# Patient Record
Sex: Female | Born: 1965
Health system: Southern US, Community
[De-identification: ages and names within clinical notes are randomized; demographics above are authoritative.]

## PROBLEM LIST (undated history)

## (undated) ENCOUNTER — Emergency Department (HOSPITAL_BASED_OUTPATIENT_CLINIC_OR_DEPARTMENT_OTHER): Admission: EM | Payer: 59 | Source: Home / Self Care

## (undated) DIAGNOSIS — E079 Disorder of thyroid, unspecified: Secondary | ICD-10-CM

## (undated) DIAGNOSIS — F329 Major depressive disorder, single episode, unspecified: Secondary | ICD-10-CM

## (undated) DIAGNOSIS — R519 Headache, unspecified: Secondary | ICD-10-CM

## (undated) DIAGNOSIS — F32A Depression, unspecified: Secondary | ICD-10-CM

## (undated) DIAGNOSIS — E039 Hypothyroidism, unspecified: Secondary | ICD-10-CM

## (undated) DIAGNOSIS — F419 Anxiety disorder, unspecified: Secondary | ICD-10-CM

## (undated) DIAGNOSIS — R51 Headache: Secondary | ICD-10-CM

## (undated) DIAGNOSIS — Z87442 Personal history of urinary calculi: Secondary | ICD-10-CM

## (undated) HISTORY — PX: OTHER SURGICAL HISTORY: SHX169

## (undated) HISTORY — DX: Disorder of thyroid, unspecified: E07.9

---

## 2002-01-28 ENCOUNTER — Other Ambulatory Visit: Admission: RE | Admit: 2002-01-28 | Discharge: 2002-01-28 | Payer: Self-pay | Admitting: Obstetrics & Gynecology

## 2004-09-29 ENCOUNTER — Emergency Department (HOSPITAL_COMMUNITY): Admission: EM | Admit: 2004-09-29 | Discharge: 2004-09-29 | Payer: Self-pay | Admitting: Emergency Medicine

## 2005-01-04 ENCOUNTER — Ambulatory Visit (HOSPITAL_COMMUNITY): Admission: RE | Admit: 2005-01-04 | Discharge: 2005-01-04 | Payer: Self-pay | Admitting: Family Medicine

## 2005-11-22 ENCOUNTER — Ambulatory Visit (HOSPITAL_COMMUNITY): Admission: RE | Admit: 2005-11-22 | Discharge: 2005-11-22 | Payer: Self-pay | Admitting: Family Medicine

## 2005-12-12 ENCOUNTER — Ambulatory Visit (HOSPITAL_COMMUNITY): Admission: RE | Admit: 2005-12-12 | Discharge: 2005-12-12 | Payer: Self-pay | Admitting: Family Medicine

## 2006-07-24 ENCOUNTER — Ambulatory Visit: Payer: Self-pay | Admitting: Orthopedic Surgery

## 2007-01-17 ENCOUNTER — Ambulatory Visit (HOSPITAL_COMMUNITY): Admission: RE | Admit: 2007-01-17 | Discharge: 2007-01-17 | Payer: Self-pay | Admitting: Family Medicine

## 2008-01-05 ENCOUNTER — Ambulatory Visit (HOSPITAL_COMMUNITY): Admission: RE | Admit: 2008-01-05 | Discharge: 2008-01-05 | Payer: Self-pay | Admitting: Family Medicine

## 2008-12-23 ENCOUNTER — Ambulatory Visit (HOSPITAL_COMMUNITY): Admission: RE | Admit: 2008-12-23 | Discharge: 2008-12-23 | Payer: Self-pay | Admitting: Family Medicine

## 2009-12-13 ENCOUNTER — Ambulatory Visit (HOSPITAL_COMMUNITY): Admission: RE | Admit: 2009-12-13 | Discharge: 2009-12-13 | Payer: Self-pay | Admitting: Family Medicine

## 2009-12-21 ENCOUNTER — Ambulatory Visit (HOSPITAL_COMMUNITY): Admission: RE | Admit: 2009-12-21 | Discharge: 2009-12-21 | Payer: Self-pay | Admitting: Family Medicine

## 2010-11-08 IMAGING — US US BREAST*R*
1 series · 10 of 10 positions shown · non-contrast
Comparison: 12/13/2009 and 01/05/2008

CLINICAL DATA: Possible mass right breast identified on recent
screening mammogram dated 12/13/2009.

DIGITAL DIAGNOSTIC RIGHT MAMMOGRAM WITHOUT CAD AND RIGHT BREAST
ULTRASOUND:

[Series 1: us breast*right* · 0.08mm/px · 10 of 10 slices shown]
[im 1/10]
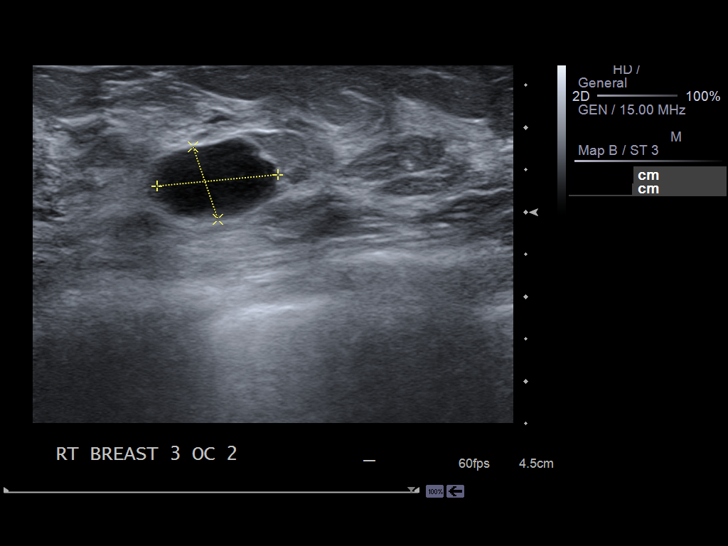
[im 2/10]
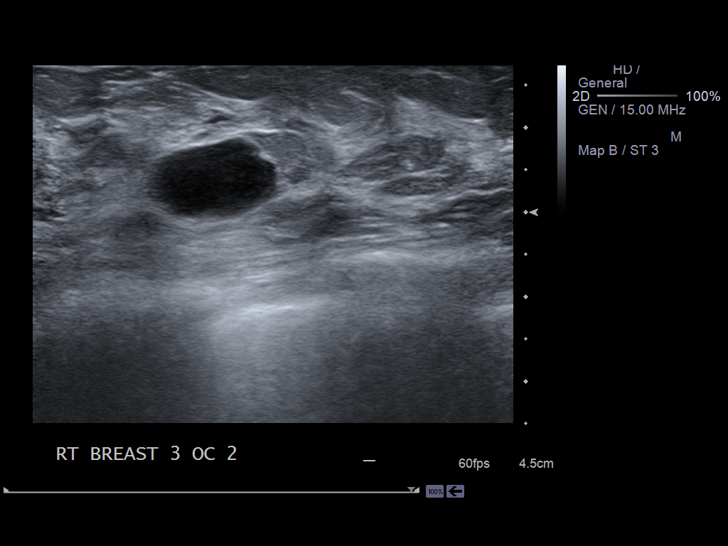
[im 3/10]
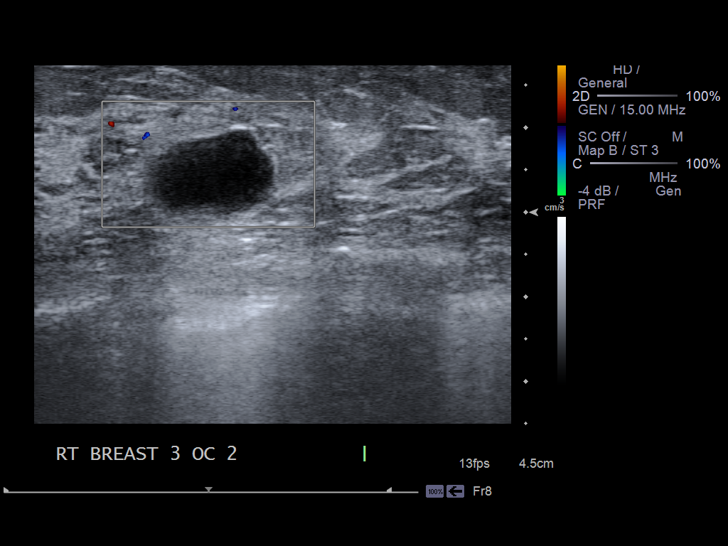
[im 4/10]
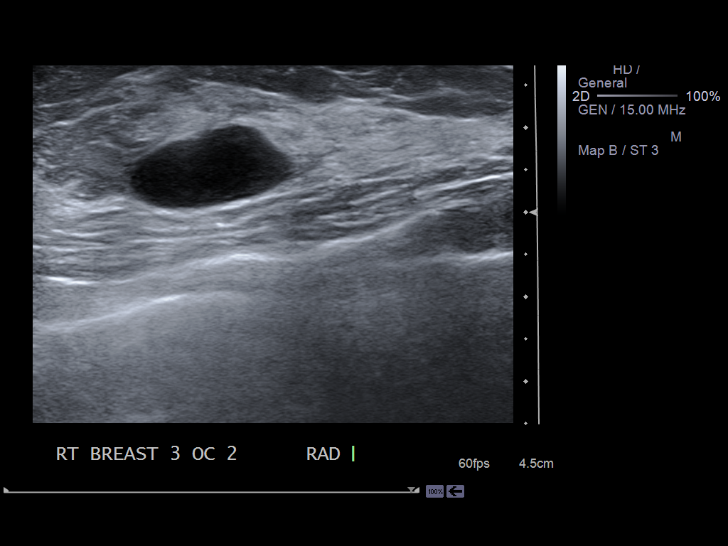
[im 5/10]
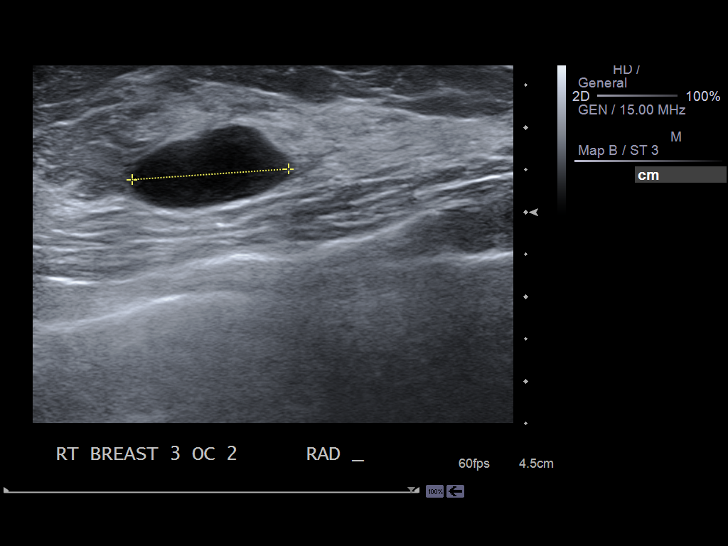
[im 6/10]
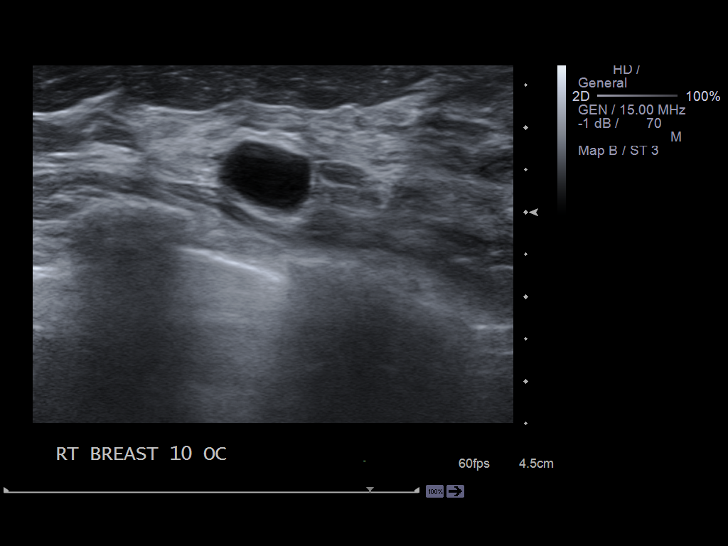
[im 7/10]
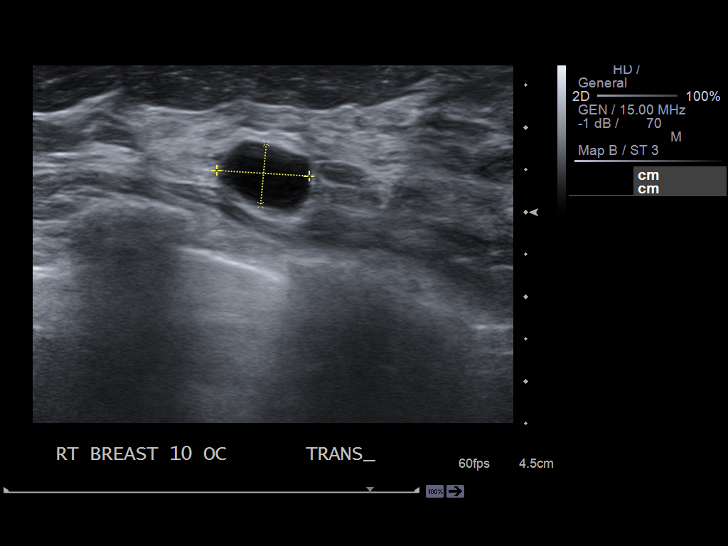
[im 8/10]
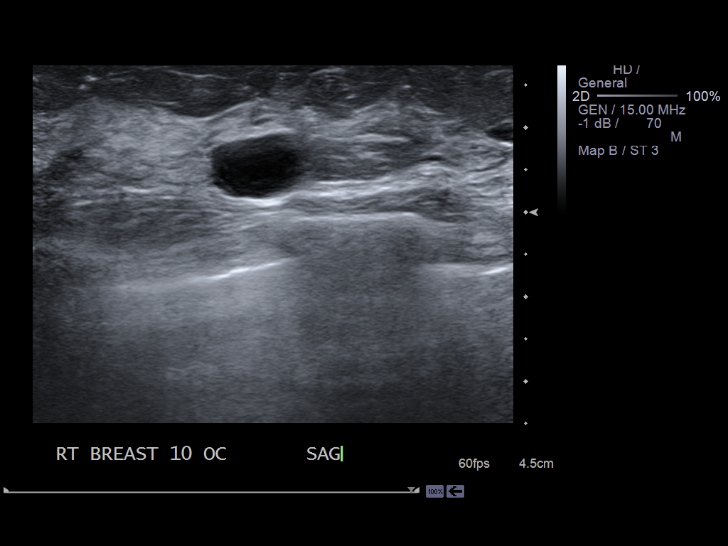
[im 9/10]
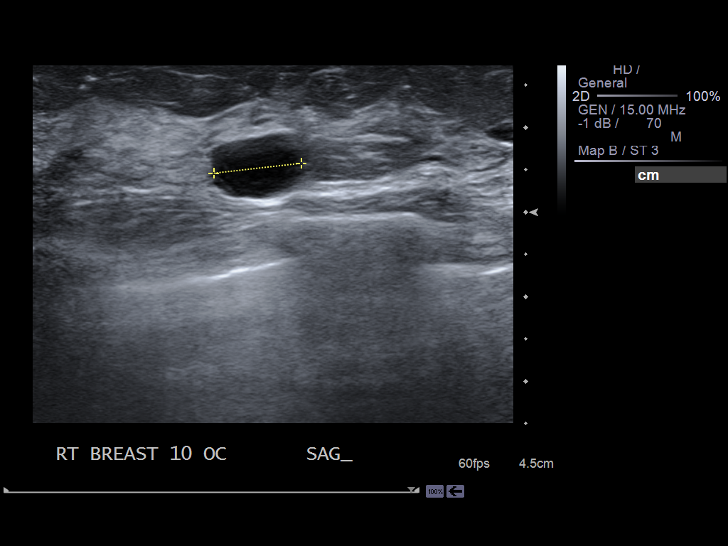
[im 10/10]
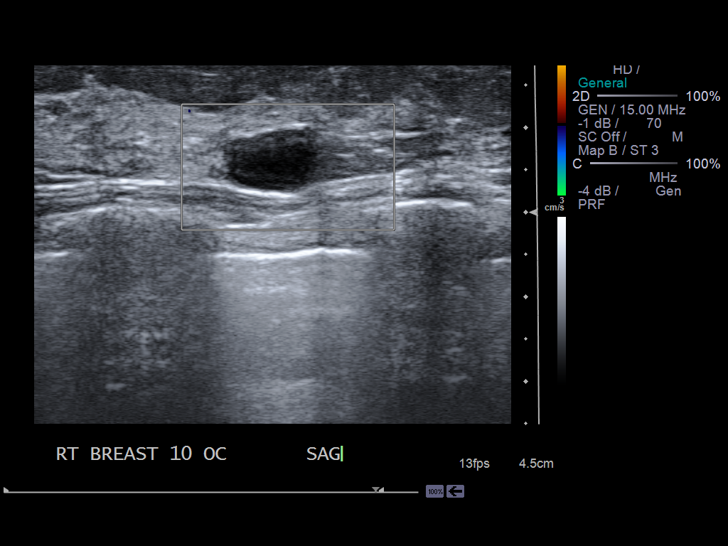

[10 of 10 positions shown; findings below may reference images not displayed]

FINDINGS: Focal spot compression views of the right breast confirm
a circumscribed oval mass in the upper right breast.  A larger
circumscribed mass in the medial right breast is seen at the level
of the nipple on the MLO view.  The breast parenchyma is
heterogeneously dense.

On physical exam, no mass is palpated in the medial or upper right
breast.

Ultrasound is performed, showing a 1.9 cm cyst at 3 o'clock
position 2 cm from the nipple.  This accounts for the larger mass
seen in the medial right breast.  At 10 o'clock position 2 cm from
the nipple is a 1.1 cm cyst.  No solid mass is identified in the
right breast.
IMPRESSION: Two simple cysts account for nodules seen on mammography.  No
evidence of malignancy.  Bilateral screening mammogram in 1 year is
recommended.

BI-RADS CATEGORY 2:  Benign finding(s).

## 2012-06-23 ENCOUNTER — Emergency Department (HOSPITAL_COMMUNITY)
Admission: EM | Admit: 2012-06-23 | Discharge: 2012-06-23 | Disposition: A | Discharge status: Home / Self Care | Payer: 59 | Source: Home / Self Care | Attending: Emergency Medicine | Admitting: Emergency Medicine

## 2012-06-23 ENCOUNTER — Encounter (HOSPITAL_COMMUNITY): Payer: Self-pay | Admitting: Emergency Medicine

## 2012-06-23 ENCOUNTER — Emergency Department (INDEPENDENT_AMBULATORY_CARE_PROVIDER_SITE_OTHER): Payer: 59

## 2012-06-23 DIAGNOSIS — R0789 Other chest pain: Secondary | ICD-10-CM

## 2012-06-23 HISTORY — DX: Major depressive disorder, single episode, unspecified: F32.9

## 2012-06-23 HISTORY — DX: Anxiety disorder, unspecified: F41.9

## 2012-06-23 HISTORY — DX: Depression, unspecified: F32.A

## 2012-06-23 NOTE — ED Provider Notes (Signed)
History     CSN: [id number]  Arrival date & time 06/23/12  1140   First MD Initiated Contact with Patient 06/23/12 1150      Chief Complaint  Patient presents with  . Chest Pain    (Consider location/radiation/quality/duration/timing/severity/associated sxs/prior treatment) HPI Comments: Patient presents urgent care complaining of a sore chest for about 4 days. Her husband brings her in to be checked for this chest pain as the psychiatrist recommended her to come here to make sure this symptom is not related to a " heart condition". Patient feels that she has been having inside the related symptoms and she attributes this discomfort to her outside. Patient denies any nausea, vomiting diaphoresis and the pain is exacerbated with certain movements and deep breathing. She has had the exact same symptom when she has had anxiety in the past. The pain is described as " it's just a soreness", patient points to upper retrosternal region.  Denies further symptoms such as shortness of breath, palpitations, paresthesias, or severe headaches  Patient is a 47 y.o. female presenting with chest pain. The history is provided by the patient and the spouse.  Chest Pain Pain location:  Substernal area Pain quality: aching, sharp and tightness   Pain radiates to the back: no   Pain severity:  Moderate Timing:  Constant Chronicity:  Recurrent Context: breathing, at rest and stress   Context: not raising an arm and no trauma   Relieved by:  Nothing Worsened by:  Coughing, deep breathing and movement Ineffective treatments:  None tried Associated symptoms: anxiety   Associated symptoms: no abdominal pain, no anorexia, no back pain, no cough, no diaphoresis, no dizziness, no fever, no nausea, no numbness, no palpitations and no shortness of breath   Risk factors: no aortic disease, no coronary artery disease, not pregnant, no prior DVT/PE, no smoking and no surgery     Past Medical History  Diagnosis  Date  . Anxiety   . Depression     History reviewed. No pertinent past surgical history.  History reviewed. No pertinent family history.  History  Substance Use Topics  . Smoking status: Never Smoker   . Smokeless tobacco: Not on file  . Alcohol Use: No    OB History   Grav Para Term Preterm Abortions TAB SAB Ect Mult Living                  Review of Systems  Constitutional: Negative for fever, chills, diaphoresis, activity change and appetite change.  HENT: Negative for neck pain and neck stiffness.   Respiratory: Negative for apnea, cough and shortness of breath.   Cardiovascular: Positive for chest pain. Negative for palpitations and leg swelling.  Gastrointestinal: Negative for nausea, abdominal pain and anorexia.  Musculoskeletal: Negative for back pain.  Neurological: Negative for dizziness, facial asymmetry, speech difficulty and numbness.  Psychiatric/Behavioral: Negative for suicidal ideas, hallucinations, behavioral problems, sleep disturbance, self-injury, dysphoric mood, decreased concentration and agitation. The patient is nervous/anxious. The patient is not hyperactive.     Allergies  Review of patient's allergies indicates no known allergies.  Home Medications   Current Outpatient Rx  Name  Route  Sig  Dispense  Refill  . LEVOTHYROXINE SODIUM PO   Oral   Take by mouth.         . Sertraline HCl (ZOLOFT PO)   Oral   Take by mouth.           BP 121/77  Pulse 66  Temp(Src)  98.1 F (36.7 C) (Oral)  Resp 18  SpO2 100%  LMP 06/14/2012  Physical Exam  Nursing note and vitals reviewed. Constitutional: She is oriented to person, place, and time. Vital signs are normal. She appears well-developed and well-nourished.  Non-toxic appearance. She does not have a sickly appearance. She does not appear ill. No distress.  HENT:  Head: Normocephalic.  Mouth/Throat: No oropharyngeal exudate.  Eyes: Conjunctivae are normal.  Neck: Neck supple. No JVD  present.  Cardiovascular: Normal rate, regular rhythm, normal heart sounds and normal pulses.  Exam reveals no gallop and no friction rub.   No murmur heard. Pulmonary/Chest: Effort normal. No respiratory distress. She has no decreased breath sounds. She has no wheezes. She has no rhonchi. She has no rales. She exhibits no tenderness.  Abdominal: Soft. There is no tenderness. There is no guarding.  Musculoskeletal: She exhibits no tenderness.  Lymphadenopathy:    She has no cervical adenopathy.  Neurological: She is alert and oriented to person, place, and time.  Skin: No rash noted. No erythema.    ED Course  Procedures (including critical care time)  Labs Reviewed - No data to display Dg Chest 2 View  06/23/2012  *RADIOLOGY REPORT*  Clinical Data: Chest pain  CHEST - 2 VIEW  Comparison: 01/04/2005  Findings: The cardiomediastinal silhouette is unremarkable. Mild peribronchial thickening again noted. There is no evidence of focal airspace disease, pulmonary edema, suspicious pulmonary nodule/mass, pleural effusion, or pneumothorax. No acute bony abnormalities are identified.  IMPRESSION: No evidence of active cardiopulmonary disease.   Original Report Authenticated By: Harmon Pier, M.D.      1. Chest pain, atypical     EKG normal -mild bradycardia at 59 beats per minute normal PR QRS duration no QT prolongation no ST or T wave inversion or elevation to suggest acute ischemia.  MDM  Patient looks comfortable- DISCOMFORT seem to be related to movement and mild palpation of her upper chest wall region. Current symptoms exam and NORMAL EKG and x-ray results were inconsistent with an acute coronary syndrome. Patient has been advised to follow-up with her primary care Dr. and to further discuss her symptoms with her psychiatrist as this is unlikely to be related to a cardiovascular condition. We discussed symptoms that should warrant further evaluation in the emergency department. Both patient  and husband agreed with treatment plan, test results and diagnostic impression.        Jimmie Molly, MD 06/23/12 [phone]

## 2012-06-23 NOTE — ED Notes (Signed)
Pt c/o chest pain onset 4 days Was adv by psychiatrist to come here Sx include: pain that increases w/activity and deep breaths, HA Hx of anxiety; has had this same pain in the past due to her anxiety Denies: SOB, blurry vision, edema  She is alert and oriented w/no signs of acute distress.

## 2013-04-12 ENCOUNTER — Encounter (HOSPITAL_COMMUNITY): Payer: Self-pay | Admitting: Emergency Medicine

## 2013-04-12 ENCOUNTER — Emergency Department (HOSPITAL_COMMUNITY): Payer: 59

## 2013-04-12 ENCOUNTER — Emergency Department (HOSPITAL_COMMUNITY)
Admission: EM | Admit: 2013-04-12 | Discharge: 2013-04-12 | Disposition: A | Discharge status: Home / Self Care | Payer: 59 | Attending: Emergency Medicine | Admitting: Emergency Medicine

## 2013-04-12 DIAGNOSIS — R0600 Dyspnea, unspecified: Secondary | ICD-10-CM

## 2013-04-12 DIAGNOSIS — F411 Generalized anxiety disorder: Secondary | ICD-10-CM | POA: Insufficient documentation

## 2013-04-12 DIAGNOSIS — Z791 Long term (current) use of non-steroidal anti-inflammatories (NSAID): Secondary | ICD-10-CM | POA: Insufficient documentation

## 2013-04-12 DIAGNOSIS — F329 Major depressive disorder, single episode, unspecified: Secondary | ICD-10-CM | POA: Insufficient documentation

## 2013-04-12 DIAGNOSIS — F3289 Other specified depressive episodes: Secondary | ICD-10-CM | POA: Insufficient documentation

## 2013-04-12 DIAGNOSIS — R0989 Other specified symptoms and signs involving the circulatory and respiratory systems: Secondary | ICD-10-CM | POA: Insufficient documentation

## 2013-04-12 DIAGNOSIS — J40 Bronchitis, not specified as acute or chronic: Secondary | ICD-10-CM | POA: Insufficient documentation

## 2013-04-12 DIAGNOSIS — Z79899 Other long term (current) drug therapy: Secondary | ICD-10-CM | POA: Insufficient documentation

## 2013-04-12 DIAGNOSIS — Z8701 Personal history of pneumonia (recurrent): Secondary | ICD-10-CM | POA: Insufficient documentation

## 2013-04-12 DIAGNOSIS — R071 Chest pain on breathing: Secondary | ICD-10-CM | POA: Insufficient documentation

## 2013-04-12 DIAGNOSIS — R0789 Other chest pain: Secondary | ICD-10-CM

## 2013-04-12 DIAGNOSIS — R0609 Other forms of dyspnea: Secondary | ICD-10-CM | POA: Insufficient documentation

## 2013-04-12 LAB — COMPREHENSIVE METABOLIC PANEL
ALT: 11 U/L (ref 0–35)
AST: 19 U/L (ref 0–37)
Albumin: 3.7 g/dL (ref 3.5–5.2)
Alkaline Phosphatase: 70 U/L (ref 39–117)
BUN: 11 mg/dL (ref 6–23)
CO2: 22 mEq/L (ref 19–32)
Calcium: 8.8 mg/dL (ref 8.4–10.5)
Chloride: 103 mEq/L (ref 96–112)
Creatinine, Ser: 0.88 mg/dL (ref 0.50–1.10)
GFR, EST NON AFRICAN AMERICAN: 78 mL/min — AB (ref >90)
Glucose, Bld: 87 mg/dL (ref 70–99)
Potassium: 3.9 mEq/L (ref 3.7–5.3)
Sodium: 142 mEq/L (ref 137–147)
Total Protein: 7.8 g/dL (ref 6.0–8.3)

## 2013-04-12 LAB — CBC WITH DIFFERENTIAL/PLATELET
BASOS ABS: 0 10*3/uL (ref 0.0–0.1)
Basophils Relative: 0 % (ref 0–1)
Eosinophils Absolute: 0.2 10*3/uL (ref 0.0–0.7)
Eosinophils Relative: 2 % (ref 0–5)
HCT: 39.4 % (ref 36.0–46.0)
Hemoglobin: 13.3 g/dL (ref 12.0–15.0)
Lymphocytes Relative: 32 % (ref 12–46)
Lymphs Abs: 2.3 10*3/uL (ref 0.7–4.0)
MCH: 31.6 pg (ref 26.0–34.0)
MCHC: 33.8 g/dL (ref 30.0–36.0)
MCV: 93.6 fL (ref 78.0–100.0)
Monocytes Absolute: 0.4 10*3/uL (ref 0.1–1.0)
Monocytes Relative: 6 % (ref 3–12)
Neutro Abs: 4.3 10*3/uL (ref 1.7–7.7)
Neutrophils Relative %: 59 % (ref 43–77)
Platelets: 247 10*3/uL (ref 150–400)
RBC: 4.21 MIL/uL (ref 3.87–5.11)
RDW: 12.4 % (ref 11.5–15.5)
WBC: 7.3 10*3/uL (ref 4.0–10.5)

## 2013-04-12 LAB — D-DIMER, QUANTITATIVE (NOT AT ARMC): D-Dimer, Quant: 0.35 ug/mL-FEU (ref 0.00–0.48)

## 2013-04-12 LAB — TROPONIN I

## 2013-04-12 LAB — I-STAT TROPONIN, ED: Troponin i, poc: 0.01 ng/mL (ref 0.00–0.08)

## 2013-04-12 LAB — PRO B NATRIURETIC PEPTIDE: PRO B NATRI PEPTIDE: 133.2 pg/mL — AB (ref 0–125)

## 2013-04-12 MED ORDER — AEROCHAMBER Z-STAT PLUS/MEDIUM MISC: 1.0000 | Freq: Once | Status: DC

## 2013-04-12 MED ORDER — IPRATROPIUM BROMIDE 0.02 % IN SOLN
0.5000 mg | Freq: Once | RESPIRATORY_TRACT | Status: AC
  Administered 2013-04-12: 0.5 mg via RESPIRATORY_TRACT
  Filled 2013-04-12: qty 2.5

## 2013-04-12 MED ORDER — PREDNISONE 20 MG PO TABS
60.0000 mg | ORAL_TABLET | Freq: Once | ORAL | Status: AC
  Administered 2013-04-12: 60 mg via ORAL
  Filled 2013-04-12: qty 3

## 2013-04-12 MED ORDER — PREDNISONE 20 MG PO TABS: ORAL_TABLET | ORAL | Status: DC

## 2013-04-12 MED ORDER — ALBUTEROL SULFATE HFA 108 (90 BASE) MCG/ACT IN AERS: 2.0000 | INHALATION_SPRAY | RESPIRATORY_TRACT | Status: DC | PRN

## 2013-04-12 MED ORDER — ALBUTEROL SULFATE (2.5 MG/3ML) 0.083% IN NEBU
5.0000 mg | INHALATION_SOLUTION | Freq: Once | RESPIRATORY_TRACT | Status: AC
  Administered 2013-04-12: 5 mg via RESPIRATORY_TRACT
  Filled 2013-04-12: qty 6

## 2013-04-12 MED ORDER — SULFAMETHOXAZOLE-TRIMETHOPRIM 800-160 MG PO TABS: 1.0000 | ORAL_TABLET | Freq: Two times a day (BID) | ORAL | Status: DC

## 2013-04-12 MED ORDER — AEROCHAMBER PLUS FLO-VU MEDIUM MISC
1.0000 | Freq: Once | Status: AC
  Administered 2013-04-12: 1
  Filled 2013-04-12: qty 1

## 2013-04-12 NOTE — ED Notes (Signed)
Phlebotomy at bedside; then pt to xray.

## 2013-04-12 NOTE — ED Notes (Signed)
Pt ambulated in hallway on Pulse Oximetry. Saturations were 100% during the full duration of ambulation.

## 2013-04-12 NOTE — Discharge Instructions (Signed)
Use the inhaler for shortness of breath. Take the antibiotics and prednisone until gone (the prednisone may make it hard to sleep so take it in the mornings). You should be rechecked by your doctor if you aren't feeling better in the next week or sooner if you feel worse.

## 2013-04-12 NOTE — ED Notes (Signed)
Pt to ED [facility] from [facility] for evaluation of shortness of breath X 2 weeks, they gave pt 324 ASA- denies CP at this time.  IV in place, NSR on monitor.

## 2013-04-12 NOTE — ED Notes (Signed)
Pt returned from xray

## 2013-04-12 NOTE — ED Notes (Signed)
Patient transported to X-ray 

## 2013-04-12 NOTE — ED Provider Notes (Signed)
CSN: [id number]     Arrival date & time 04/12/13  1923 History   First MD Initiated Contact with Patient 04/12/13 1927     Chief Complaint  Patient presents with  . Shortness of Breath     (Consider location/radiation/quality/duration/timing/severity/associated sxs/prior Treatment) HPI Patient reports she's been having trouble breathing for the past month. She states she does not have a history of reactive airway disease. She states she did have pneumonia a few years ago but was treated as an outpatient. She's had a cough off and on, sometimes productive of green sputum. She denies any fever or chills. She complains of fatigue. She states she has pain in the center of her chest that comes and goes. She describes it as aching and sometimes sharp. It can last up to an hour. She has dyspnea on exertion but she can also be short of breath sitting doing nothing. She denies wheezing, sore throat, or rhinorrhea. She denies any pain or swelling in her legs. She states sometimes she feels like she just is getting enough air. Patient is not on any hormone replacement. She denies being a running DL she is ill. She was seen in urgent care and sent to the ER and she reports when EMS put her on oxygen it made her shortness of breath feel better.  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 She states however she is having difficulty to the doing this now.  Family history patient denies any history of heart disease. She states her mother did have DVT.  PCP [facility] in [place]  Past Medical History  Diagnosis Date  . Anxiety   . Depression    History reviewed. No pertinent past surgical history. No family history on file. History  Substance Use Topics  . Smoking status: Never Smoker   . Smokeless tobacco: Not on file  . Alcohol Use: No   Employed, works in a factory making cigarettes.  Lives at home Lives with spouse No second hand smoke   OB  History   Grav Para Term Preterm Abortions TAB SAB Ect Mult Living                 Review of Systems  All other systems reviewed and are negative.      Allergies  Review of patient's allergies indicates no known allergies.  Home Medications   Current Outpatient Rx  Name  Route  Sig  Dispense  Refill  . Aspirin-Acetaminophen-Caffeine (EXCEDRIN PO)   Oral   Take 1 tablet by mouth every 6 (six) hours as needed (headache).          . Aspirin-Acetaminophen-Caffeine (GOODY HEADACHE PO)   Oral   Take 1 Package by mouth every 8 (eight) hours as needed (body pain).         Marland Kitchen diclofenac (VOLTAREN) 75 MG EC tablet   Oral   Take 75 mg by mouth 2 (two) times daily.          . Glucosamine HCl (GLUCOSAMINE PO)   Oral   Take 2 tablets by mouth once.          Marland Kitchen levothyroxine (SYNTHROID, LEVOTHROID) 25 MCG tablet   Oral   Take 25 mcg by mouth daily before breakfast.         . sertraline (ZOLOFT) 50 MG tablet   Oral   Take 50 mg by mouth daily.  BP 129/77  Pulse 80  Temp(Src) 97.7 F (36.5 C) (Oral)  Resp 21  SpO2 100%  LMP 04/12/2013  Vital signs normal    Physical Exam  Nursing note and vitals reviewed. Constitutional: She is oriented to person, place, and time. She appears well-developed and well-nourished.  Non-toxic appearance. She does not appear ill. No distress.  HENT:  Head: Normocephalic and atraumatic.  Right Ear: External ear normal.  Left Ear: External ear normal.  Nose: Nose normal. No mucosal edema or rhinorrhea.  Mouth/Throat: Oropharynx is clear and moist and mucous membranes are normal. No dental abscesses or uvula swelling.  Eyes: Conjunctivae and EOM are normal. Pupils are equal, round, and reactive to light.  Neck: Normal range of motion and full passive range of motion without pain. Neck supple.  Cardiovascular: Normal rate, regular rhythm and normal heart sounds.  Exam reveals no gallop and no friction rub.   No murmur  heard. Pulmonary/Chest: Effort normal. No respiratory distress. She has decreased breath sounds. She has no wheezes. She has no rhonchi. She has no rales. She exhibits tenderness. She exhibits no crepitus.    Mild diminished breath sounds  Abdominal: Soft. Normal appearance and bowel sounds are normal. She exhibits no distension. There is no tenderness. There is no rebound and no guarding.  Musculoskeletal: Normal range of motion. She exhibits no edema and no tenderness.  Moves all extremities well.   Neurological: She is alert and oriented to person, place, and time. She has normal strength. No cranial nerve deficit.  Skin: Skin is warm, dry and intact. No rash noted. No erythema. No pallor.  Psychiatric: She has a normal mood and affect. Her speech is normal and behavior is normal. Her mood appears not anxious.    ED Course  Procedures (including critical care time)  Medications  aerochamber Z-Stat Plus/medium 1 each (not administered)  AEROCHAMBER PLUS FLO-VU MEDIUM device MISC 1 each (not administered)  albuterol (PROVENTIL) (2.5 MG/3ML) 0.083% nebulizer solution 5 mg (5 mg Nebulization Given 04/12/13 2124)  ipratropium (ATROVENT) nebulizer solution 0.5 mg (0.5 mg Nebulization Given 04/12/13 2124)   Pt was ambulated by nursing staff after her nebulizer treatment and her pulse ox was 100 %.  Recheck  Pt states she feels better after the nebulizer. We discussed treating her for bronchitis with inhaler, steroids, antibiotics.  She can be rechecked by her PCP next week if not improving.     Labs Review Results for orders placed during the hospital encounter of 04/12/13  CBC WITH DIFFERENTIAL      Result Value Ref Range   WBC 7.3  4.0 - 10.5 K/uL   RBC 4.21  3.87 - 5.11 MIL/uL   Hemoglobin 13.3  12.0 - 15.0 g/dL   HCT 16.1  09.6 - 04.5 %   MCV 93.6  78.0 - 100.0 fL   MCH 31.6  26.0 - 34.0 pg   MCHC 33.8  30.0 - 36.0 g/dL   RDW 40.9  81.1 - 91.4 %   Platelets 247  150 - 400 K/uL    Neutrophils Relative % 59  43 - 77 %   Neutro Abs 4.3  1.7 - 7.7 K/uL   Lymphocytes Relative 32  12 - 46 %   Lymphs Abs 2.3  0.7 - 4.0 K/uL   Monocytes Relative 6  3 - 12 %   Monocytes Absolute 0.4  0.1 - 1.0 K/uL   Eosinophils Relative 2  0 - 5 %   Eosinophils Absolute  0.2  0.0 - 0.7 K/uL   Basophils Relative 0  0 - 1 %   Basophils Absolute 0.0  0.0 - 0.1 K/uL  COMPREHENSIVE METABOLIC PANEL      Result Value Ref Range   Sodium 142  137 - 147 mEq/L   Potassium 3.9  3.7 - 5.3 mEq/L   Chloride 103  96 - 112 mEq/L   CO2 22  19 - 32 mEq/L   Glucose, Bld 87  70 - 99 mg/dL   BUN 11  6 - 23 mg/dL   Creatinine, Ser 4.090.88  0.50 - 1.10 mg/dL   Calcium 8.8  8.4 - 81.110.5 mg/dL   Total Protein 7.8  6.0 - 8.3 g/dL   Albumin 3.7  3.5 - 5.2 g/dL   AST 19  0 - 37 U/L   ALT 11  0 - 35 U/L   Alkaline Phosphatase 70  39 - 117 U/L   Total Bilirubin <0.2 (*) 0.3 - 1.2 mg/dL   GFR calc non Af Amer 78 (*) >90 mL/min   GFR calc Af Amer >90  >90 mL/min  TROPONIN I      Result Value Ref Range   Troponin I <0.30  <0.30 ng/mL  PRO B NATRIURETIC PEPTIDE      Result Value Ref Range   Pro B Natriuretic peptide (BNP) 133.2 (*) 0 - 125 pg/mL  D-DIMER, QUANTITATIVE      Result Value Ref Range   D-Dimer, Quant 0.35  0.00 - 0.48 ug/mL-FEU  I-STAT TROPOININ, ED      Result Value Ref Range   Troponin i, poc 0.01  0.00 - 0.08 ng/mL   Comment 3            Laboratory interpretation all normal    Imaging Review Dg Chest 2 View  04/12/2013   CLINICAL DATA:  Shortness of breath, cough and congestion.  EXAM: CHEST  2 VIEW  COMPARISON:  Chest radiograph performed 06/23/2012  FINDINGS: The lungs are well-aerated and clear. There is no evidence of focal opacification, pleural effusion or pneumothorax.  The heart is normal in size; the mediastinal contour is within normal limits. No acute osseous abnormalities are seen.  IMPRESSION: No acute cardiopulmonary process seen.   Electronically Signed   By: Roanna RaiderJeffery  Chang  M.D.   On: 04/12/2013 21:59    EKG Interpretation    Date/Time:  Sunday April 12 2013 19:30:24 EST Ventricular Rate:  68 PR Interval:  149 QRS Duration: 70 QT Interval:  429 QTC Calculation: 456 R Axis:   53 Text Interpretation:  Sinus rhythm Normal ECG No old tracing to compare Confirmed by Merian Wroe  MD-I, Markeda Narvaez ([id number]) on 04/12/2013 8:54:48 PM            MDM   Final diagnoses:  Dyspnea  Right-sided chest wall pain  Bronchitis   New Prescriptions   ALBUTEROL (PROVENTIL HFA;VENTOLIN HFA) 108 (90 BASE) MCG/ACT INHALER    Inhale 2 puffs into the lungs every 4 (four) hours as needed for wheezing or shortness of breath.   PREDNISONE (DELTASONE) 20 MG TABLET    Take 3 po QD x 2d starting tomorrow, then 2 po QD x 3d then 1 po QD x 3d   SULFAMETHOXAZOLE-TRIMETHOPRIM (SEPTRA DS) 800-160 MG PER TABLET    Take 1 tablet by mouth every 12 (twelve) hours.    Plan discharge  Devoria AlbeIva Ever Gustafson, MD, [facility]      Hal Norrington L Ivania Teagarden, MD 04/12/13 [phone]

## 2013-04-13 ENCOUNTER — Telehealth: Payer: Self-pay | Admitting: *Deleted

## 2013-04-13 NOTE — Telephone Encounter (Signed)
FMLA paper signed and mailed to patient.

## 2013-12-02 ENCOUNTER — Other Ambulatory Visit (HOSPITAL_COMMUNITY): Payer: Self-pay | Admitting: Internal Medicine

## 2013-12-02 DIAGNOSIS — R109 Unspecified abdominal pain: Secondary | ICD-10-CM

## 2013-12-03 ENCOUNTER — Ambulatory Visit (HOSPITAL_COMMUNITY)
Admission: RE | Admit: 2013-12-03 | Discharge: 2013-12-03 | Disposition: A | Discharge status: Home / Self Care | Payer: 59 | Source: Ambulatory Visit | Attending: Internal Medicine | Admitting: Internal Medicine

## 2013-12-03 DIAGNOSIS — R109 Unspecified abdominal pain: Secondary | ICD-10-CM

## 2013-12-03 DIAGNOSIS — K5730 Diverticulosis of large intestine without perforation or abscess without bleeding: Secondary | ICD-10-CM | POA: Insufficient documentation

## 2013-12-03 DIAGNOSIS — R1032 Left lower quadrant pain: Secondary | ICD-10-CM | POA: Diagnosis present

## 2013-12-03 MED ORDER — IOHEXOL 300 MG/ML  SOLN
100.0000 mL | Freq: Once | INTRAMUSCULAR | Status: AC | PRN
  Administered 2013-12-03: 100 mL via INTRAVENOUS

## 2015-12-02 ENCOUNTER — Ambulatory Visit (INDEPENDENT_AMBULATORY_CARE_PROVIDER_SITE_OTHER): Payer: 59 | Admitting: Pediatrics

## 2015-12-02 ENCOUNTER — Encounter (INDEPENDENT_AMBULATORY_CARE_PROVIDER_SITE_OTHER): Payer: Self-pay

## 2015-12-02 ENCOUNTER — Encounter: Payer: Self-pay | Admitting: Pediatrics

## 2015-12-02 VITALS — BP 134/89 | HR 58 | Temp 97.4°F | Ht 63.0 in | Wt 164.0 lb

## 2015-12-02 DIAGNOSIS — E663 Overweight: Secondary | ICD-10-CM

## 2015-12-02 DIAGNOSIS — F329 Major depressive disorder, single episode, unspecified: Secondary | ICD-10-CM

## 2015-12-02 DIAGNOSIS — R030 Elevated blood-pressure reading, without diagnosis of hypertension: Secondary | ICD-10-CM | POA: Diagnosis not present

## 2015-12-02 DIAGNOSIS — E039 Hypothyroidism, unspecified: Secondary | ICD-10-CM | POA: Diagnosis not present

## 2015-12-02 DIAGNOSIS — F32A Depression, unspecified: Secondary | ICD-10-CM

## 2015-12-02 MED ORDER — LEVOTHYROXINE SODIUM 25 MCG PO TABS: 25.0000 ug | ORAL_TABLET | Freq: Every day | ORAL | 1 refills | Status: DC

## 2015-12-02 MED ORDER — SERTRALINE HCL 50 MG PO TABS: 50.0000 mg | ORAL_TABLET | Freq: Every day | ORAL | 1 refills | Status: DC

## 2015-12-02 NOTE — Progress Notes (Signed)
  Subjective:   Patient ID: Megan Hicks, female    DOB: December 30, 1965, 50 y.o.   MRN: [id number] CC: New Patient (Initial Visit) and multipl emed problem f/u  HPI: [name] is a 50 y.o. female presenting for New Patient (Initial Visit)  OCD and depression: Husbands health has been down, he has heart failure She does a lot of the care taking '50mg'$  sertraline has kept symptoms well controlled  Hypothyroidism: Has been 46mg for several years Had a deep aching feeling, started getting better with medication, tiredness better,   Elevated BP: Lots of headaches recently Taking tylenol, ibuprofen as needed  Stays active, physical activity at work  Had mammogram earlier this year, was fine Pap smear: last was many years ago, no h/o abnormal Regular periods now  Past Medical History:  Diagnosis Date  . Anxiety   . Depression   . Thyroid disease    Family History  Problem Relation Age of Onset  . Arthritis Mother   . Hearing loss Mother   . Hypertension Mother   . Arthritis Father   . Diabetes Father    No colon cancer   Social History   Social History  . Marital status: Married    Spouse name: N/A  . Number of children: N/A  . Years of education: N/A   Social History Main Topics  . Smoking status: Never Smoker  . Smokeless tobacco: Never Used  . Alcohol use No  . Drug use: No  . Sexual activity: Not Asked   Other Topics Concern  . None   Social History Narrative  . None   ROS: All systems negative other than what is in HPI  Objective:    BP 134/89   Pulse (!) 58   Temp 97.4 F (36.3 C) (Oral)   Ht '5\' 3"'$  (1.6 m)   Wt 164 lb (74.4 kg)   LMP 11/27/2015   BMI 29.05 kg/m   Wt Readings from Last 3 Encounters:  12/02/15 164 lb (74.4 kg)    Gen: NAD, alert, cooperative with exam, NCAT EYES: EOMI, no conjunctival injection, or no icterus ENT:  TMs pearly gray b/l, OP without erythema LYMPH: no cervical LAD CV: NRRR, normal S1/S2, no murmur,  distal pulses 2+ b/l Resp: CTABL, no wheezes, normal WOB Abd: +BS, soft, NTND. no guarding or organomegaly Ext: No edema, warm Neuro: Alert and oriented, strength equal b/l UE and LE, coordination grossly normal MSK: normal muscle bulk  Assessment & Plan:  ANeenahwas seen today for new patient (initial visit), f/u med problems.  Diagnoses and all orders for this visit:  Hypothyroidism, unspecified type No symptoms now Says she felt much better after starting the synthroid several years ago -     levothyroxine (SYNTHROID, LEVOTHROID) 25 MCG tablet; Take 1 tablet (25 mcg total) by mouth daily before breakfast. - TSH today  OCD and Depression, unspecified depression type Symptoms well controlled, cont med -     sertraline (ZOLOFT) 50 MG tablet; Take 1 tablet (50 mg total) by mouth daily.  Elevated blood pressure reading Improved slightly at recheck Is having some headaches at home Check BP at home, bring to next clinic visit Let me know if elevated  -     BMP8+EGFR  BMI 29 Discussed lifestyle changes Stay active Increase fruits, vegetables  Follow up plan: Return in about 4 weeks (around 12/30/2015) for pap smear/CPE. CAssunta Found MD [place]

## 2015-12-03 DIAGNOSIS — F32A Depression, unspecified: Secondary | ICD-10-CM | POA: Insufficient documentation

## 2015-12-03 DIAGNOSIS — R030 Elevated blood-pressure reading, without diagnosis of hypertension: Secondary | ICD-10-CM | POA: Insufficient documentation

## 2015-12-03 DIAGNOSIS — F329 Major depressive disorder, single episode, unspecified: Secondary | ICD-10-CM | POA: Insufficient documentation

## 2015-12-03 DIAGNOSIS — E663 Overweight: Secondary | ICD-10-CM | POA: Insufficient documentation

## 2015-12-03 DIAGNOSIS — E039 Hypothyroidism, unspecified: Secondary | ICD-10-CM | POA: Insufficient documentation

## 2015-12-03 LAB — BMP8+EGFR
BUN/Creatinine Ratio: 15 (ref 9–23)
BUN: 11 mg/dL (ref 6–24)
CO2: 25 mmol/L (ref 18–29)
Calcium: 9.8 mg/dL (ref 8.7–10.2)
Chloride: 101 mmol/L (ref 96–106)
Creatinine, Ser: 0.74 mg/dL (ref 0.57–1.00)
GFR calc Af Amer: 111 mL/min/{1.73_m2} (ref >59)
GFR, EST NON AFRICAN AMERICAN: 96 mL/min/{1.73_m2} (ref >59)
Glucose: 97 mg/dL (ref 65–99)
POTASSIUM: 4.7 mmol/L (ref 3.5–5.2)
Sodium: 140 mmol/L (ref 134–144)

## 2015-12-03 LAB — TSH: TSH: 2.42 u[IU]/mL (ref 0.450–4.500)

## 2015-12-30 ENCOUNTER — Ambulatory Visit: Payer: 59 | Admitting: Pediatrics

## 2016-01-16 ENCOUNTER — Encounter (INDEPENDENT_AMBULATORY_CARE_PROVIDER_SITE_OTHER): Payer: Self-pay

## 2016-01-16 ENCOUNTER — Encounter: Payer: Self-pay | Admitting: Pediatrics

## 2016-01-16 ENCOUNTER — Ambulatory Visit (INDEPENDENT_AMBULATORY_CARE_PROVIDER_SITE_OTHER): Payer: 59 | Admitting: Pediatrics

## 2016-01-16 VITALS — BP 123/79 | HR 77 | Temp 98.6°F | Ht 63.0 in | Wt 167.0 lb

## 2016-01-16 DIAGNOSIS — Z0000 Encounter for general adult medical examination without abnormal findings: Secondary | ICD-10-CM

## 2016-01-16 DIAGNOSIS — B001 Herpesviral vesicular dermatitis: Secondary | ICD-10-CM

## 2016-01-16 DIAGNOSIS — N898 Other specified noninflammatory disorders of vagina: Secondary | ICD-10-CM

## 2016-01-16 LAB — WET PREP FOR TRICH, YEAST, CLUE
Clue Cell Exam: NEGATIVE
TRICHOMONAS EXAM: NEGATIVE
Yeast Exam: POSITIVE — AB

## 2016-01-16 MED ORDER — VALACYCLOVIR HCL 500 MG PO TABS: ORAL_TABLET | ORAL | 1 refills | Status: DC

## 2016-01-16 NOTE — Progress Notes (Signed)
  Subjective:   Patient ID: Megan CorwinAlice G Hicks, female    DOB: 10/25/1965, 50 y.o.   MRN: [id number] CC: Gynecologic Exam  HPI: Megan Hicks is a 50 y.o. 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 Allergies and medications reviewed and updated. History  Smoking Status  . Never Smoker  Smokeless Tobacco  . Never Used   ROS: All systems negative other than what is in HPI  Objective:    BP 123/79   Pulse 77   Temp 98.6 F (37 C) (Oral)   Ht 5\' 3"  (1.6 m)   Wt 167 lb (75.8 kg)   BMI 29.58 kg/m   Wt Readings from Last 3 Encounters:  01/16/16 167 lb (75.8 kg)  12/02/15 164 lb (74.4 kg)    Gen: NAD, alert, cooperative with exam, NCAT EYES: EOMI, no conjunctival injection, or no icterus CV: NRRR, normal S1/S2, no murmur, distal pulses 2+ b/l Resp: CTABL, no wheezes, normal WOB Abd: +BS, soft, NTND. no guarding or organomegaly Ext: No edema, warm Neuro: Alert and oriented, strength equal b/l UE and LE, coordination grossly normal MSK: normal muscle bulk GU: normal external female genitalia, closed cervix, slightly friable, moderate amount of white discharge present vaginal vault Breast: normal exam b/l  Assessment & Plan:  Megan Hicks was seen today for CPE.  Diagnoses and all orders for this  visit:  Encounter for preventive health examination Overall doing well Will get wet prep for vaginal discharge, routine pap today -     WET PREP FOR TRICH, YEAST, CLUE -     Pap IG and HPV (high risk) DNA detection  Herpes labialis Needs below occasionally -     valACYclovir (VALTREX) 500 MG tablet; Take 4 tabs twice a day for 1 day as needed  Vaginal discharge -     WET PREP FOR TRICH, YEAST, CLUE  Mammogram: UTD, last 03/2016  Follow up plan: Return in about 1 year (around 01/15/2017). Rex Krasarol Vincent, MD [facility]

## 2016-01-17 ENCOUNTER — Other Ambulatory Visit: Payer: Self-pay | Admitting: *Deleted

## 2016-01-17 MED ORDER — FLUCONAZOLE 150 MG PO TABS: 150.0000 mg | ORAL_TABLET | Freq: Once | ORAL | 0 refills | Status: AC

## 2016-01-17 NOTE — Progress Notes (Unsigned)
fluc

## 2016-01-19 LAB — PAP IG AND HPV HIGH-RISK
HPV, high-risk: POSITIVE — AB
PAP SMEAR COMMENT: 0

## 2016-02-09 ENCOUNTER — Encounter: Payer: Self-pay | Admitting: *Deleted

## 2016-02-17 ENCOUNTER — Other Ambulatory Visit: Payer: Self-pay | Admitting: *Deleted

## 2016-02-17 MED ORDER — FLUCONAZOLE 150 MG PO TABS: 150.0000 mg | ORAL_TABLET | Freq: Once | ORAL | 0 refills | Status: AC

## 2016-04-10 ENCOUNTER — Other Ambulatory Visit: Payer: Self-pay | Admitting: Pediatrics

## 2016-04-10 DIAGNOSIS — E039 Hypothyroidism, unspecified: Secondary | ICD-10-CM

## 2016-04-19 ENCOUNTER — Other Ambulatory Visit: Payer: Self-pay | Admitting: Pediatrics

## 2016-04-19 DIAGNOSIS — F32A Depression, unspecified: Secondary | ICD-10-CM

## 2016-04-19 DIAGNOSIS — F329 Major depressive disorder, single episode, unspecified: Secondary | ICD-10-CM

## 2016-06-13 ENCOUNTER — Encounter (HOSPITAL_COMMUNITY): Payer: Self-pay | Admitting: Emergency Medicine

## 2016-06-13 ENCOUNTER — Emergency Department (HOSPITAL_COMMUNITY): Payer: Commercial Managed Care - HMO

## 2016-06-13 ENCOUNTER — Emergency Department (HOSPITAL_COMMUNITY)
Admission: EM | Admit: 2016-06-13 | Discharge: 2016-06-13 | Disposition: A | Discharge status: Home / Self Care | Payer: Commercial Managed Care - HMO | Attending: Emergency Medicine | Admitting: Emergency Medicine

## 2016-06-13 DIAGNOSIS — G43009 Migraine without aura, not intractable, without status migrainosus: Secondary | ICD-10-CM | POA: Diagnosis not present

## 2016-06-13 DIAGNOSIS — Z7982 Long term (current) use of aspirin: Secondary | ICD-10-CM | POA: Diagnosis not present

## 2016-06-13 DIAGNOSIS — G43909 Migraine, unspecified, not intractable, without status migrainosus: Secondary | ICD-10-CM | POA: Diagnosis not present

## 2016-06-13 DIAGNOSIS — R519 Headache, unspecified: Secondary | ICD-10-CM

## 2016-06-13 DIAGNOSIS — E039 Hypothyroidism, unspecified: Secondary | ICD-10-CM | POA: Diagnosis not present

## 2016-06-13 DIAGNOSIS — R51 Headache: Secondary | ICD-10-CM | POA: Diagnosis not present

## 2016-06-13 LAB — I-STAT CHEM 8, ED
BUN: 8 mg/dL (ref 6–20)
Calcium, Ion: 1.15 mmol/L (ref 1.15–1.40)
Chloride: 103 mmol/L (ref 101–111)
Creatinine, Ser: 0.6 mg/dL (ref 0.44–1.00)
GLUCOSE: 108 mg/dL — AB (ref 65–99)
HCT: 39 % (ref 36.0–46.0)
Hemoglobin: 13.3 g/dL (ref 12.0–15.0)
POTASSIUM: 3.6 mmol/L (ref 3.5–5.1)
Sodium: 138 mmol/L (ref 135–145)
TCO2: 25 mmol/L (ref 0–100)

## 2016-06-13 LAB — CBC
HEMATOCRIT: 37.9 % (ref 36.0–46.0)
Hemoglobin: 12.3 g/dL (ref 12.0–15.0)
MCH: 29.9 pg (ref 26.0–34.0)
MCHC: 32.5 g/dL (ref 30.0–36.0)
MCV: 92 fL (ref 78.0–100.0)
Platelets: 229 10*3/uL (ref 150–400)
RBC: 4.12 MIL/uL (ref 3.87–5.11)
RDW: 12.7 % (ref 11.5–15.5)
WBC: 4.9 10*3/uL (ref 4.0–10.5)

## 2016-06-13 LAB — BASIC METABOLIC PANEL
Anion gap: 9 (ref 5–15)
BUN: 6 mg/dL (ref 6–20)
CO2: 23 mmol/L (ref 22–32)
Calcium: 9.2 mg/dL (ref 8.9–10.3)
Chloride: 105 mmol/L (ref 101–111)
Creatinine, Ser: 0.68 mg/dL (ref 0.44–1.00)
GFR calc Af Amer: >60 mL/min (ref >60)
GFR calc non Af Amer: >60 mL/min (ref >60)
GLUCOSE: 108 mg/dL — AB (ref 65–99)
Potassium: 3.7 mmol/L (ref 3.5–5.1)
Sodium: 137 mmol/L (ref 135–145)

## 2016-06-13 LAB — I-STAT BETA HCG BLOOD, ED (MC, WL, AP ONLY)

## 2016-06-13 MED ORDER — BUTALBITAL-APAP-CAFFEINE 50-325-40 MG PO TABS: 1.0000 | ORAL_TABLET | Freq: Four times a day (QID) | ORAL | 0 refills | Status: DC | PRN

## 2016-06-13 MED ORDER — IOPAMIDOL (ISOVUE-370) INJECTION 76%
INTRAVENOUS | Status: AC
Start: 2016-06-13 — End: 2016-06-13
  Administered 2016-06-13: 50 mL
  Filled 2016-06-13: qty 50

## 2016-06-13 MED ORDER — SODIUM CHLORIDE 0.9 % IV BOLUS (SEPSIS)
1000.0000 mL | Freq: Once | INTRAVENOUS | Status: AC
  Administered 2016-06-13: 1000 mL via INTRAVENOUS

## 2016-06-13 MED ORDER — PROMETHAZINE HCL 25 MG PO TABS: 25.0000 mg | ORAL_TABLET | Freq: Four times a day (QID) | ORAL | 0 refills | Status: DC | PRN

## 2016-06-13 MED ORDER — METOCLOPRAMIDE HCL 5 MG/ML IJ SOLN
10.0000 mg | Freq: Once | INTRAMUSCULAR | Status: AC
  Administered 2016-06-13: 10 mg via INTRAVENOUS
  Filled 2016-06-13: qty 2

## 2016-06-13 MED ORDER — DIPHENHYDRAMINE HCL 50 MG/ML IJ SOLN
25.0000 mg | Freq: Once | INTRAMUSCULAR | Status: AC
  Administered 2016-06-13: 25 mg via INTRAVENOUS
  Filled 2016-06-13: qty 1

## 2016-06-13 MED ORDER — DEXAMETHASONE SODIUM PHOSPHATE 10 MG/ML IJ SOLN
10.0000 mg | Freq: Once | INTRAMUSCULAR | Status: AC
Start: 2016-06-13 — End: 2016-06-13
  Administered 2016-06-13: 10 mg via INTRAVENOUS
  Filled 2016-06-13: qty 1

## 2016-06-13 MED ORDER — KETOROLAC TROMETHAMINE 15 MG/ML IJ SOLN
15.0000 mg | Freq: Once | INTRAMUSCULAR | Status: AC
  Administered 2016-06-13: 15 mg via INTRAVENOUS
  Filled 2016-06-13: qty 1

## 2016-06-13 MED ORDER — ASPIRIN 81 MG PO CHEW: 81.0000 mg | CHEWABLE_TABLET | Freq: Every day | ORAL | 0 refills | Status: AC

## 2016-06-13 NOTE — ED Triage Notes (Signed)
Pt sts woke up this am with severe HA and N/V that now improved with some blurry vision

## 2016-06-13 NOTE — ED Notes (Signed)
Pt ambulated to restroom from room, tolerated well. 

## 2016-06-13 NOTE — Discharge Instructions (Signed)
-   Call one of the numbers above to set up an appointment with a Neurologist - Return to the ER immediately if you develop any new or worsening symptoms, including any one-sided weakness, numbness, facial drooping, difficulty speaking, or difficulty swallowing  - While today's headache was likely due to a migraine, it would be beneficial to start taking one baby aspirin a day until cleared by a Neurologist

## 2016-06-13 NOTE — ED Provider Notes (Signed)
[facility]-EMERGENCY DEPT Provider Note   CSN: [id number] Arrival date & time: 06/13/16  4098     History   Chief Complaint Chief Complaint  Patient presents with  . Headache  . Blurred Vision    HPI Megan Hicks is a 51 y.o. female.  HPI   51 yo F with h/o depression, anxiety, h/o migraine headaches here with HA. Pt states she awoke this morning around 5-6 AM with an aching, throbbing, severe right-sided headache. She also felt like her right eyelid "wouldn't stay up." Denies any vision changes. Her HA is now improved. She had associated severe nausea and vomiting with 6-7 episodes of NBNB emesis. No fevers. No recent head trauma. No focal weakness, numbness, or gait instability. She has a h/o migraines with similar sx though not as severe. Denies any recent stressors or med changes.  Past Medical History:  Diagnosis Date  . Anxiety   . Depression   . Thyroid disease     Patient Active Problem List   Diagnosis Date Noted  . Overweight (BMI 25.0-29.9) 12/03/2015  . Hypothyroidism 12/03/2015  . Elevated blood pressure reading 12/03/2015  . Depression 12/03/2015    History reviewed. No pertinent surgical history.  OB History    No data available       Home Medications    Prior to Admission medications   Medication Sig Start Date End Date Taking? Authorizing Provider  aspirin 81 MG chewable tablet Chew 1 tablet (81 mg total) by mouth daily. 06/13/16 07/13/16  Shaune Pollack, MD  Aspirin-Acetaminophen-Caffeine (EXCEDRIN PO) Take 1 tablet by mouth every 6 (six) hours as needed (headache).     Historical Provider, MD  Aspirin-Acetaminophen-Caffeine (GOODY HEADACHE PO) Take 1 Package by mouth every 8 (eight) hours as needed (body pain).    Historical Provider, MD  butalbital-acetaminophen-caffeine (FIORICET, ESGIC) [phone] MG tablet Take 1-2 tablets by mouth every 6 (six) hours as needed for headache. 06/13/16 06/13/17  Shaune Pollack, MD  levothyroxine (SYNTHROID,  LEVOTHROID) 25 MCG tablet Take 1 Tablet by mouth once daily before breakfast 04/10/16   Johna Sheriff, MD  promethazine (PHENERGAN) 25 MG tablet Take 1 tablet (25 mg total) by mouth every 6 (six) hours as needed for nausea or vomiting. 06/13/16   Shaune Pollack, MD  sertraline (ZOLOFT) 50 MG tablet Take 1 Tablet by mouth once daily 04/19/16   Johna Sheriff, MD  valACYclovir (VALTREX) 500 MG tablet Take 4 tabs twice a day for 1 day as needed 01/16/16   Johna Sheriff, MD    Family History Family History  Problem Relation Age of Onset  . Arthritis Mother   . Hearing loss Mother   . Hypertension Mother   . Arthritis Father   . Diabetes Father     Social History Social History  Substance Use Topics  . Smoking status: Never Smoker  . Smokeless tobacco: Never Used  . Alcohol use No     Allergies   Patient has no known allergies.   Review of Systems Review of Systems  Constitutional: Positive for fatigue. Negative for chills and fever.  HENT: Negative for congestion, rhinorrhea and sore throat.   Eyes: Negative for visual disturbance.  Respiratory: Negative for cough, shortness of breath and wheezing.   Cardiovascular: Negative for chest pain and leg swelling.  Gastrointestinal: Positive for nausea and vomiting. Negative for abdominal pain and diarrhea.  Genitourinary: Negative for dysuria, flank pain, vaginal bleeding and vaginal discharge.  Musculoskeletal: Negative for neck pain.  Skin: Negative for rash.  Allergic/Immunologic: Negative for immunocompromised state.  Neurological: Positive for headaches. Negative for syncope.  Hematological: Does not bruise/bleed easily.  All other systems reviewed and are negative.    Physical Exam Updated Vital Signs BP 114/63 (BP Location: Right Arm)   Pulse 73   Temp 97.5 F (36.4 C) (Oral)   Resp 15   LMP 05/30/2016   SpO2 100%   Physical Exam  Constitutional: She is oriented to person, place, and time. She appears  well-developed and well-nourished. No distress.  HENT:  Head: Normocephalic and atraumatic.  Eyes: Conjunctivae are normal.  Neck: Neck supple.  Cardiovascular: Normal rate, regular rhythm and normal heart sounds.  Exam reveals no friction rub.   No murmur heard. Pulmonary/Chest: Effort normal and breath sounds normal. No respiratory distress. She has no wheezes. She has no rales.  Abdominal: She exhibits no distension.  Musculoskeletal: She exhibits no edema.  Neurological: She is alert and oriented to person, place, and time. She exhibits normal muscle tone.  Skin: Skin is warm. Capillary refill takes less than 2 seconds.  Psychiatric: She has a normal mood and affect.  Nursing note and vitals reviewed.   Neurological Exam:  Mental Status: Alert and oriented to person, place, and time. Attention and concentration normal. Speech clear. Recent memory is intact. Cranial Nerves: Visual fields grossly intact. EOMI and PERRLA. No nystagmus noted. Facial sensation intact at forehead, maxillary cheek, and chin/mandible bilaterally. No facial asymmetry or weakness. Hearing grossly normal. Uvula is midline, and palate elevates symmetrically. Normal SCM and trapezius strength. Tongue midline without fasciculations. Motor: Muscle strength 5/5 in proximal and distal UE and LE bilaterally. No pronator drift. Muscle tone normal. Reflexes: 2+ and symmetrical in all four extremities.  Sensation: Intact to light touch in upper and lower extremities distally bilaterally.  Gait: Normal without ataxia. Coordination: Normal FTN bilaterally.     ED Treatments / Results  Labs (all labs ordered are listed, but only abnormal results are displayed) Labs Reviewed  BASIC METABOLIC PANEL - Abnormal; Notable for the following:       Result Value   Glucose, Bld 108 (*)    All other components within normal limits  I-STAT CHEM 8, ED - Abnormal; Notable for the following:    Glucose, Bld 108 (*)    All other  components within normal limits  CBC  I-STAT BETA HCG BLOOD, ED ([facility], [facility], [facility] ONLY)    EKG  EKG Interpretation None       Radiology Ct Angio Head W Or Wo Contrast  Result Date: 06/13/2016 CLINICAL DATA:  Sudden onset of headache and nausea this morning. EXAM: CT ANGIOGRAPHY HEAD AND NECK TECHNIQUE: Multidetector CT imaging of the head and neck was performed using the standard protocol during bolus administration of intravenous contrast. Multiplanar CT image reconstructions and MIPs were obtained to evaluate the vascular anatomy. Carotid stenosis measurements (when applicable) are obtained utilizing NASCET criteria, using the distal internal carotid diameter as the denominator. CONTRAST:  Isovue 370, 50 mL. COMPARISON:  MR brain 12/06/2010. FINDINGS: CT HEAD Calvarium and skull base: No fracture or destructive lesion. Mastoids and middle ears are grossly clear. Paranasal sinuses: Imaged portions are clear. Orbits: Negative. Brain: No evidence of acute abnormality, including acute infarct, hemorrhage, hydrocephalus, or mass lesion. Normal cerebral volume. No white matter disease. CTA NECK Aortic arch: Standard branching. Imaged portion shows no evidence of aneurysm or dissection. No significant stenosis of the major arch vessel origins. Right carotid system:  Unremarkable. No evidence of dissection, stenosis (50% or greater) or occlusion. Left carotid system: Unremarkable. No evidence of dissection, stenosis (50% or greater) or occlusion. Vertebral arteries: Codominant. No evidence of dissection, stenosis (50% or greater) or occlusion. CTA HEAD Anterior circulation: Unremarkable. No significant stenosis, proximal occlusion, aneurysm, or vascular malformation. Posterior circulation: Unremarkable. No significant stenosis, proximal occlusion, aneurysm, or vascular malformation. Venous sinuses: As permitted by contrast timing, patent. 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  Delayed phase:   No abnormal intracranial enhancement. Review of the MIP images confirms the above findings IMPRESSION: Negative exam.  No cause is identified for the reported symptoms. Electronically Signed   By: Elsie Stain M.D.   On: 06/13/2016 12:00   Ct Angio Neck W And/or Wo Contrast  Result Date: 06/13/2016 CLINICAL DATA:  Sudden onset of headache and nausea this morning. EXAM: CT ANGIOGRAPHY HEAD AND NECK TECHNIQUE: Multidetector CT imaging of the head and neck was performed using the standard protocol during bolus administration of intravenous contrast. Multiplanar CT image reconstructions and MIPs were obtained to evaluate the vascular anatomy. Carotid stenosis measurements (when applicable) are obtained utilizing NASCET criteria, using the distal internal carotid diameter as the denominator. CONTRAST:  Isovue 370, 50 mL. COMPARISON:  MR brain 12/06/2010. FINDINGS: CT HEAD Calvarium and skull base: No fracture or destructive lesion. Mastoids and middle ears are grossly clear. Paranasal sinuses: Imaged portions are clear. Orbits: Negative. Brain: No evidence of acute abnormality, including acute infarct, hemorrhage, hydrocephalus, or mass lesion. Normal cerebral volume. No white matter disease. CTA NECK Aortic arch: Standard branching. Imaged portion shows no evidence of aneurysm or dissection. No significant stenosis of the major arch vessel origins. Right carotid system: Unremarkable. No evidence of dissection, stenosis (50% or greater) or occlusion. Left carotid system: Unremarkable. No evidence of dissection, stenosis (50% or greater) or occlusion. Vertebral arteries: Codominant. No evidence of dissection, stenosis (50% or greater) or occlusion. CTA HEAD Anterior circulation: Unremarkable. No significant stenosis, proximal occlusion, aneurysm, or vascular malformation. Posterior circulation: Unremarkable. No significant stenosis, proximal occlusion, aneurysm, or vascular malformation. Venous  sinuses: As permitted by contrast timing, patent. 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 Delayed phase:   No abnormal intracranial enhancement. Review of the MIP images confirms the above findings IMPRESSION: Negative exam.  No cause is identified for the reported symptoms. Electronically Signed   By: Elsie Stain M.D.   On: 06/13/2016 12:00    Procedures Procedures (including critical care time)  Medications Ordered in ED Medications  metoCLOPramide (REGLAN) injection 10 mg (10 mg Intravenous Given 06/13/16 1036)  diphenhydrAMINE (BENADRYL) injection 25 mg (25 mg Intravenous Given 06/13/16 1036)  sodium chloride 0.9 % bolus 1,000 mL (0 mLs Intravenous Stopped 06/13/16 1226)  iopamidol (ISOVUE-370) 76 % injection (50 mLs  Contrast Given 06/13/16 1141)  ketorolac (TORADOL) 15 MG/ML injection 15 mg (15 mg Intravenous Given 06/13/16 1230)  dexamethasone (DECADRON) injection 10 mg (10 mg Intravenous Given 06/13/16 1230)     Initial Impression / Assessment and Plan / ED Course  I have reviewed the triage vital signs and the nursing notes.  Pertinent labs & imaging results that were available during my care of the patient were reviewed by me and considered in my medical decision making (see chart for details).    51 yo F with PMHx as above here with HA, transient R-sided facial "heaviness" without overt facial droop. History of migraines. Suspect complex versus recurrent migraine, and sx improving at this  time. Sx began with HA and have resolved with resolution of HA, making TIA unlikely. Do not suspect CVA. No h/o HTN, HLD, or CVA risk factors. She did endorse acute onset of HA so CT obtained for eval of SAH/intracranial mass/bleed, which is negative for any arterial or other abnormality. Her sx have resolved with migraine meds in ED. Labs are reassuring. Will d/c with supportive care, migraine meds, and outpt Neuro follow-up for further work-up. Regarding her risk  factors for TIA, she is low risk with ABCD score of 1 and sx are more c/w migraine, but will advise ASA if desired with outpt neuro f/u. Return precautions given.  Final Clinical Impressions(s) / ED Diagnoses   Final diagnoses:  Migraine without aura and without status migrainosus, not intractable  Acute nonintractable headache, unspecified headache type    New Prescriptions New Prescriptions   ASPIRIN 81 MG CHEWABLE TABLET    Chew 1 tablet (81 mg total) by mouth daily.   BUTALBITAL-ACETAMINOPHEN-CAFFEINE (FIORICET, ESGIC) 50-325-40 MG TABLET    Take 1-2 tablets by mouth every 6 (six) hours as needed for headache.   PROMETHAZINE (PHENERGAN) 25 MG TABLET    Take 1 tablet (25 mg total) by mouth every 6 (six) hours as needed for nausea or vomiting.     Shaune Pollack, MD 06/13/16 [phone]

## 2016-06-13 NOTE — ED Notes (Signed)
Papers reviewed wit patient and family and they verbalize understanding

## 2016-08-04 ENCOUNTER — Other Ambulatory Visit: Payer: Self-pay | Admitting: Pediatrics

## 2016-08-04 DIAGNOSIS — F329 Major depressive disorder, single episode, unspecified: Secondary | ICD-10-CM

## 2016-08-04 DIAGNOSIS — F32A Depression, unspecified: Secondary | ICD-10-CM

## 2016-12-11 ENCOUNTER — Encounter: Payer: Self-pay | Admitting: Pediatrics

## 2016-12-11 ENCOUNTER — Ambulatory Visit (INDEPENDENT_AMBULATORY_CARE_PROVIDER_SITE_OTHER): Payer: 59 | Admitting: Pediatrics

## 2016-12-11 VITALS — BP 124/76 | HR 71 | Temp 97.7°F | Ht 63.0 in | Wt 175.0 lb

## 2016-12-11 DIAGNOSIS — L720 Epidermal cyst: Secondary | ICD-10-CM

## 2016-12-11 DIAGNOSIS — Z1239 Encounter for other screening for malignant neoplasm of breast: Secondary | ICD-10-CM

## 2016-12-11 DIAGNOSIS — G43809 Other migraine, not intractable, without status migrainosus: Secondary | ICD-10-CM | POA: Diagnosis not present

## 2016-12-11 DIAGNOSIS — Z1231 Encounter for screening mammogram for malignant neoplasm of breast: Secondary | ICD-10-CM | POA: Diagnosis not present

## 2016-12-11 DIAGNOSIS — B001 Herpesviral vesicular dermatitis: Secondary | ICD-10-CM

## 2016-12-11 DIAGNOSIS — E039 Hypothyroidism, unspecified: Secondary | ICD-10-CM

## 2016-12-11 MED ORDER — VALACYCLOVIR HCL 500 MG PO TABS: ORAL_TABLET | ORAL | 1 refills | Status: DC

## 2016-12-11 MED ORDER — RIZATRIPTAN BENZOATE 5 MG PO TABS: 5.0000 mg | ORAL_TABLET | ORAL | 1 refills | Status: DC | PRN

## 2016-12-11 NOTE — Progress Notes (Signed)
  Subjective:   Patient ID: Megan Hicks, female    DOB: 09/11/1965, 51 y.o.   MRN: [id number] CC: Cyst (Right Chest)  HPI: Megan Corwinlice G Boese is a 51 y.o. female presenting for Cyst (Right Chest)  Comes and goes, has been there for at least a couple of years About a week ago was red, tender Small amount of fluid came out of it Much improved now Has gotten big and smaller a couple of times over last few years No fevers Feeling well overall  Has h/o migraines Happen rarely  Seen in ED several months ago for a migraine They last several hours when they do come on  Hypothyroidism: taking med regularly  Cold sores: valtrex helps, happens rarely  Relevant past medical, surgical, family and social history reviewed. Allergies and medications reviewed and updated. History  Smoking Status  . Never Smoker  Smokeless Tobacco  . Never Used   Family History  Problem Relation Age of Onset  . Arthritis Mother   . Hearing loss Mother   . Hypertension Mother   . Arthritis Father   . Diabetes Father     ROS: Per HPI   Objective:    BP 124/76   Pulse 71   Temp 97.7 F (36.5 C) (Oral)   Ht 5\' 3"  (1.6 m)   Wt 175 lb (79.4 kg)   BMI 31.00 kg/m   Wt Readings from Last 3 Encounters:  12/11/16 175 lb (79.4 kg)  01/16/16 167 lb (75.8 kg)  12/02/15 164 lb (74.4 kg)    Gen: NAD, alert, cooperative with exam, NCAT EYES: EOMI, no conjunctival injection, or no icterus ENT:  OP without erythema LYMPH: no cervical LAD CV: NRRR, normal S1/S2, no murmur, distal pulses 2+ b/l Resp: CTABL, no wheezes, normal WOB Ext: No edema, warm Neuro: Alert and oriented, strength equal b/l UE and LE Breast: slightly nodular breasts, no discrete masses, per pt unchanged Mid sternum with hard flat nodule, mobile, under skin with prominent follicle/pore, no redness or tenderness  Assessment & Plan:  Megan Hicks was seen today for cyst.  Diagnoses and all orders for this visit:  Other migraine without  status migrainosus, not intractable Trial of below prn -     Basic Metabolic Panel -     rizatriptan (MAXALT) 5 MG tablet; Take 1 tablet (5 mg total) by mouth as needed for migraine. May repeat in 2 hours if needed  Herpes labialis Stable, cont below prn -     valACYclovir (VALTREX) 500 MG tablet; Take 4 tabs twice a day for 1 day as needed  Hypothyroidism, unspecified type -     TSH  Epidermoid cyst Discussed options, does not want resected now  Screening for breast cancer -     MM Digital Screening; Future  Follow up plan: Return in about 2 months (around 02/10/2017) for well exam. Rex Krasarol Shirin Echeverry, MD [facility]

## 2016-12-11 NOTE — Patient Instructions (Addendum)
Megan Hicks Mammogram Appointment: [phone]  Come back in December-January for well exam, you are due for repeat pap smear

## 2016-12-12 LAB — BASIC METABOLIC PANEL
BUN/Creatinine Ratio: 13 (ref 9–23)
BUN: 9 mg/dL (ref 6–24)
CALCIUM: 9 mg/dL (ref 8.7–10.2)
CHLORIDE: 105 mmol/L (ref 96–106)
CO2: 23 mmol/L (ref 20–29)
Creatinine, Ser: 0.69 mg/dL (ref 0.57–1.00)
GFR calc non Af Amer: 103 mL/min/{1.73_m2} (ref >59)
GFR, EST AFRICAN AMERICAN: 118 mL/min/{1.73_m2} (ref >59)
Glucose: 86 mg/dL (ref 65–99)
POTASSIUM: 4 mmol/L (ref 3.5–5.2)
Sodium: 141 mmol/L (ref 134–144)

## 2016-12-12 LAB — TSH: TSH: 2.98 u[IU]/mL (ref 0.450–4.500)

## 2016-12-13 ENCOUNTER — Other Ambulatory Visit: Payer: Self-pay | Admitting: Pediatrics

## 2016-12-13 DIAGNOSIS — E039 Hypothyroidism, unspecified: Secondary | ICD-10-CM

## 2017-01-21 ENCOUNTER — Other Ambulatory Visit: Payer: Self-pay | Admitting: *Deleted

## 2017-01-21 DIAGNOSIS — E039 Hypothyroidism, unspecified: Secondary | ICD-10-CM

## 2017-01-21 MED ORDER — LEVOTHYROXINE SODIUM 25 MCG PO TABS: ORAL_TABLET | ORAL | 2 refills | Status: DC

## 2017-01-22 DIAGNOSIS — L509 Urticaria, unspecified: Secondary | ICD-10-CM | POA: Diagnosis not present

## 2017-08-12 ENCOUNTER — Encounter: Payer: 59 | Admitting: Pediatrics

## 2017-08-29 ENCOUNTER — Encounter: Payer: Self-pay | Admitting: Pediatrics

## 2017-08-29 ENCOUNTER — Ambulatory Visit: Payer: 59 | Admitting: Pediatrics

## 2017-08-29 VITALS — BP 125/86 | HR 83 | Temp 97.4°F | Ht 63.0 in | Wt 164.0 lb

## 2017-08-29 DIAGNOSIS — J069 Acute upper respiratory infection, unspecified: Secondary | ICD-10-CM

## 2017-08-29 DIAGNOSIS — G43809 Other migraine, not intractable, without status migrainosus: Secondary | ICD-10-CM

## 2017-08-29 MED ORDER — RIZATRIPTAN BENZOATE 5 MG PO TABS: 5.0000 mg | ORAL_TABLET | ORAL | 1 refills | Status: DC | PRN

## 2017-08-29 MED ORDER — PROMETHAZINE HCL 25 MG PO TABS: 25.0000 mg | ORAL_TABLET | Freq: Three times a day (TID) | ORAL | 0 refills | Status: DC | PRN

## 2017-08-29 NOTE — Patient Instructions (Signed)

## 2017-08-29 NOTE — Progress Notes (Signed)
  Subjective:   Patient ID: Megan CorwinAlice G Hicks, female    DOB: 12/15/1965, 52 y.o.   MRN: [id number] CC: Sore Throat and Headache  HPI: Megan Hicks is a 52 y.o. female   URI symptoms started a few days ago.  Afterwards had sore throat, now that has resolved.  Now with some congestion.  No fevers.  Small amount of dry cough.  Appetite is been okay.  Her last 1 to 2 days her typical migraine come back.  Feeling slightly nauseous with it.  Rarely has headaches, and about once every 3 to 4 months.  They feel like this will need to come on.  Relevant past medical, surgical, family and social history reviewed. Allergies and medications reviewed and updated. Social History   Tobacco Use  Smoking Status Never Smoker  Smokeless Tobacco Never Used   ROS: Per HPI   Objective:    BP 125/86   Pulse 83   Temp (!) 97.4 F (36.3 C)   Ht 5\' 3"  (1.6 m)   Wt 164 lb (74.4 kg)   BMI 29.05 kg/m   Wt Readings from Last 3 Encounters:  08/29/17 164 lb (74.4 kg)  12/11/16 175 lb (79.4 kg)  01/16/16 167 lb (75.8 kg)    Gen: NAD, alert, cooperative with exam, NCAT EYES: EOMI, no conjunctival injection, or no icterus ENT:  TMs dull gray b/l, OP with mild erythema LYMPH: no cervical LAD CV: NRRR, normal S1/S2, no murmur, distal pulses 2+ b/l Resp: CTABL, no wheezes, normal WOB Abd: +BS, soft, NTND. no guarding or organomegaly Ext: No edema, warm Neuro: Alert and oriented, CN III-XII intact, sensation intact bilateral extremities.  Strength equal bilateral arms and legs.  Coronation grossly normal. MSK: normal muscle bulk  Assessment & Plan:  Megan Hicks was seen today for sore throat and headache.  Diagnoses and all orders for this visit:  Acute URI Discussed symptomatic care, return precautions.   Other migraine without status migrainosus, not intractable Refilled Maxalt.  Usually headache resolves with ibuprofen or Tylenol.  Not happening often enough for patient want to be on prophylactic  medicine right now.  Let me know if that worsens. -     promethazine (PHENERGAN) 25 MG tablet; Take 1 tablet (25 mg total) by mouth every 8 (eight) hours as needed for nausea or vomiting. -     rizatriptan (MAXALT) 5 MG tablet; Take 1 tablet (5 mg total) by mouth as needed for migraine. May repeat in 2 hours if needed   Follow up plan: Has upcoming physical Rex Krasarol Vincent, MD [facility]

## 2017-09-01 ENCOUNTER — Encounter: Payer: Self-pay | Admitting: Pediatrics

## 2017-09-05 ENCOUNTER — Encounter: Payer: Self-pay | Admitting: Pediatrics

## 2017-09-05 ENCOUNTER — Ambulatory Visit: Payer: 59 | Admitting: Pediatrics

## 2017-09-05 ENCOUNTER — Ambulatory Visit (INDEPENDENT_AMBULATORY_CARE_PROVIDER_SITE_OTHER): Payer: 59 | Admitting: Pediatrics

## 2017-09-05 VITALS — BP 127/84 | HR 70 | Temp 97.0°F | Ht 63.0 in | Wt 166.8 lb

## 2017-09-05 DIAGNOSIS — B001 Herpesviral vesicular dermatitis: Secondary | ICD-10-CM

## 2017-09-05 DIAGNOSIS — E039 Hypothyroidism, unspecified: Secondary | ICD-10-CM

## 2017-09-05 DIAGNOSIS — R87619 Unspecified abnormal cytological findings in specimens from cervix uteri: Secondary | ICD-10-CM

## 2017-09-05 DIAGNOSIS — Z0000 Encounter for general adult medical examination without abnormal findings: Secondary | ICD-10-CM

## 2017-09-05 DIAGNOSIS — J0100 Acute maxillary sinusitis, unspecified: Secondary | ICD-10-CM

## 2017-09-05 MED ORDER — LEVOTHYROXINE SODIUM 25 MCG PO TABS: ORAL_TABLET | ORAL | 3 refills | Status: DC

## 2017-09-05 MED ORDER — AMOXICILLIN-POT CLAVULANATE 875-125 MG PO TABS: 1.0000 | ORAL_TABLET | Freq: Two times a day (BID) | ORAL | 0 refills | Status: DC

## 2017-09-05 MED ORDER — VALACYCLOVIR HCL 500 MG PO TABS: ORAL_TABLET | ORAL | 1 refills | Status: DC

## 2017-09-05 NOTE — Progress Notes (Signed)
  Subjective:   Patient ID: Megan CorwinAlice G Hicks, female    DOB: 10/17/1965, 52 y.o.   MRN: [id number] CC: Annual Exam and Gynecologic Exam  HPI: Megan Corwinlice G Hicks is a 52 y.o. female   Seen last week with URI symptoms.  She was feeling better over the weekend, but this morning she woke up feeling worse, bloody discharge from her nose.  After she got it out she felt less pressure in her nose.  Feeling hot and cold, no known temperatures.  Appetite is been okay, stomach is been slightly off since she is been sick.  Some loose stools.  Has an active job.  Trying to increase fruit and vegetable intake.  Last Pap smear: 01/2016.  HPV positive.  Relevant past medical, surgical, family and social history reviewed. Allergies and medications reviewed and updated. Social History   Tobacco Use  Smoking Status Never Smoker  Smokeless Tobacco Never Used   ROS: All systems negative other than what is in HPI  Objective:    BP 127/84   Pulse 70   Temp (!) 97 F (36.1 C) (Oral)   Ht 5\' 3"  (1.6 m)   Wt 166 lb 12.8 oz (75.7 kg)   BMI 29.55 kg/m   Wt Readings from Last 3 Encounters:  09/05/17 166 lb 12.8 oz (75.7 kg)  08/29/17 164 lb (74.4 kg)  12/11/16 175 lb (79.4 kg)    Gen: NAD, alert, cooperative with exam, NCAT EYES: EOMI, no conjunctival injection, or no icterus ENT:  TMs slightly injected with slight layering fluid bilaterally, OP with mild erythema LYMPH: no cervical LAD CV: NRRR, normal S1/S2, no murmur, distal pulses 2+ b/l Resp: CTABL, no wheezes, normal WOB Abd: +BS, soft, NTND. no guarding or organomegaly Ext: No edema, warm Neuro: Alert and oriented, strength equal b/l UE and LE, coordination grossly normal MSK: normal muscle bulk GU: Cervix normal-appearing.  Friable.  Small amount white discharge present in vaginal vault.  Assessment & Plan:  Fulton Molelice was seen today for annual exam and gynecologic exam.  Diagnoses and all orders for this visit:  Encounter for preventive  health examination -     Basic Metabolic Panel -     MM Digital Screening; Future -     Ambulatory referral to Gastroenterology  Hypothyroidism, unspecified type -     levothyroxine (SYNTHROID, LEVOTHROID) 25 MCG tablet; Take 1 Tablet by mouth once daily before breakfast -     TSH  Herpes labialis Stable, symptoms improve with below -     valACYclovir (VALTREX) 500 MG tablet; Take 4 tabs twice a day for 1 day as needed  Acute maxillary sinusitis, recurrence not specified Continue symptom care, saltwater spray.  Start below. -     amoxicillin-clavulanate (AUGMENTIN) 875-125 MG tablet; Take 1 tablet by mouth 2 (two) times daily.  Abnormal cervical Papanicolaou smear, unspecified abnormal pap finding -     Pap IG and HPV (high risk) DNA detection   Follow up plan: Return in about 6 months (around 03/08/2018). Rex Krasarol Imran Nuon, MD [facility]

## 2017-09-06 LAB — BASIC METABOLIC PANEL
BUN / CREAT RATIO: 18 (ref 9–23)
BUN: 14 mg/dL (ref 6–24)
CO2: 26 mmol/L (ref 20–29)
Calcium: 9.3 mg/dL (ref 8.7–10.2)
Chloride: 102 mmol/L (ref 96–106)
Creatinine, Ser: 0.76 mg/dL (ref 0.57–1.00)
GFR, EST AFRICAN AMERICAN: 107 mL/min/{1.73_m2} (ref >59)
GFR, EST NON AFRICAN AMERICAN: 92 mL/min/{1.73_m2} (ref >59)
Glucose: 67 mg/dL (ref 65–99)
POTASSIUM: 4.2 mmol/L (ref 3.5–5.2)
SODIUM: 143 mmol/L (ref 134–144)

## 2017-09-06 LAB — TSH: TSH: 2.38 u[IU]/mL (ref 0.450–4.500)

## 2017-09-09 LAB — PAP IG AND HPV HIGH-RISK
HPV, high-risk: NEGATIVE
PAP Smear Comment: 0

## 2017-09-12 ENCOUNTER — Telehealth: Payer: Self-pay | Admitting: Pediatrics

## 2017-09-16 ENCOUNTER — Telehealth: Payer: Self-pay | Admitting: Pediatrics

## 2017-09-16 ENCOUNTER — Encounter: Payer: Self-pay | Admitting: Internal Medicine

## 2017-12-24 ENCOUNTER — Other Ambulatory Visit: Payer: Self-pay | Admitting: *Deleted

## 2017-12-24 ENCOUNTER — Ambulatory Visit: Payer: Commercial Managed Care - HMO | Admitting: Gastroenterology

## 2017-12-24 ENCOUNTER — Encounter: Payer: Self-pay | Admitting: Gastroenterology

## 2017-12-24 ENCOUNTER — Encounter: Payer: Self-pay | Admitting: *Deleted

## 2017-12-24 ENCOUNTER — Telehealth: Payer: Self-pay | Admitting: *Deleted

## 2017-12-24 DIAGNOSIS — Z1211 Encounter for screening for malignant neoplasm of colon: Secondary | ICD-10-CM

## 2017-12-24 MED ORDER — NA SULFATE-K SULFATE-MG SULF 17.5-3.13-1.6 GM/177ML PO SOLN: 1.0000 | ORAL | 0 refills | Status: DC

## 2017-12-24 NOTE — Assessment & Plan Note (Signed)
52 year old pleasant female with need for initial screening colonoscopy. No concerning upper or lower GI signs/symptoms. She was brought in due to possible report of loose stools; upon further discussion, she has no more than 2 bowel movements per day, alternating loose and solid, chronic and unchanged. No alarm symptoms, no rectal bleeding or abdominal pain. She states this is her "baseline" and does not affect quality of life.   Proceed with TCS with Dr. Jena Gauss in near future: the risks, benefits, and alternatives have been discussed with the patient in detail. The patient states understanding and desires to proceed.

## 2017-12-24 NOTE — Telephone Encounter (Signed)
PA for TCS initiated via [facility] website and approved. The notification/prior authorization reference number is [id number]

## 2017-12-24 NOTE — Progress Notes (Signed)
Primary Care Physician:  Eustaquio Maize, MD Primary Gastroenterologist:  Dr. Gala Romney    Chief Complaint  Patient presents with  . Colonoscopy    never had tcs    HPI:   Megan Hicks is a 52 y.o. female presenting today at the request of Dr. Evette Doffing to arrange initial screening colonoscopy. She was brought in due to reports of loose stool.  Denies rectal bleeding. No abdominal pain. Notes losing 10 lbs due to "stress". No chronic upper GI symptoms. No dysphagia. She has 2 loose stools per day, chronically. Sometimes well-formed. Alternates. No abdominal pain or cramping. No postprandial component. Does not interfere with her daily activities. She has no concerns about this.   Past Medical History:  Diagnosis Date  . Anxiety   . Depression   . Thyroid disease     Past Surgical History:  Procedure Laterality Date  . None      Current Outpatient Medications  Medication Sig Dispense Refill  . Aspirin-Acetaminophen-Caffeine (EXCEDRIN PO) Take 1 tablet by mouth every 6 (six) hours as needed (headache).     Marland Kitchen levothyroxine (SYNTHROID, LEVOTHROID) 25 MCG tablet Take 1 Tablet by mouth once daily before breakfast 90 tablet 3  . promethazine (PHENERGAN) 25 MG tablet Take 1 tablet (25 mg total) by mouth every 8 (eight) hours as needed for nausea or vomiting. 12 tablet 0  . rizatriptan (MAXALT) 5 MG tablet Take 1 tablet (5 mg total) by mouth as needed for migraine. May repeat in 2 hours if needed 10 tablet 1  . valACYclovir (VALTREX) 500 MG tablet Take 4 tabs twice a day for 1 day as needed 10 tablet 1  . Na Sulfate-K Sulfate-Mg Sulf 17.5-3.13-1.6 GM/177ML SOLN Take 1 kit by mouth as directed. 1 Bottle 0   No current facility-administered medications for this visit.     Allergies as of 12/24/2017  . (No Known Allergies)    Family History  Problem Relation Age of Onset  . Arthritis Mother   . Hearing loss Mother   . Hypertension Mother   . Arthritis Father   . Diabetes  Father   . Colon cancer Neg Hx   . Colon polyps Neg Hx     Social History   Socioeconomic History  . Marital status: Married    Spouse name: Not on file  . Number of children: Not on file  . Years of education: Not on file  . Highest education level: Not on file  Occupational History  . Not on file  Social Needs  . Financial resource strain: Not on file  . Food insecurity:    Worry: Not on file    Inability: Not on file  . Transportation needs:    Medical: Not on file    Non-medical: Not on file  Tobacco Use  . Smoking status: Never Smoker  . Smokeless tobacco: Never Used  Substance and Sexual Activity  . Alcohol use: No  . Drug use: No  . Sexual activity: Not on file  Lifestyle  . Physical activity:    Days per week: Not on file    Minutes per session: Not on file  . Stress: Not on file  Relationships  . Social connections:    Talks on phone: Not on file    Gets together: Not on file    Attends religious service: Not on file    Active member of club or organization: Not on file    Attends meetings of clubs  or organizations: Not on file    Relationship status: Not on file  . Intimate partner violence:    Fear of current or ex partner: Not on file    Emotionally abused: Not on file    Physically abused: Not on file    Forced sexual activity: Not on file  Other Topics Concern  . Not on file  Social History Narrative  . Not on file    Review of Systems: Gen: Denies any fever, chills, fatigue, weight loss, lack of appetite.  CV: Denies chest pain, heart palpitations, peripheral edema, syncope.  Resp: Denies shortness of breath at rest or with exertion. Denies wheezing or cough.  GI: see HPI  GU : Denies urinary burning, urinary frequency, urinary hesitancy MS: Denies joint pain, muscle weakness, cramps, or limitation of movement.  Derm: Denies rash, itching, dry skin Psych: Denies depression, anxiety, memory loss, and confusion Heme: Denies bruising,  bleeding, and enlarged lymph nodes.  Physical Exam: BP 121/86   Pulse 76   Temp (!) 97.1 F (36.2 C) (Oral)   Ht '5\' 3"'  (1.6 m)   Wt 157 lb 9.6 oz (71.5 kg)   LMP 12/17/2017 (Approximate)   BMI 27.92 kg/m  General:   Alert and oriented. Pleasant and cooperative. Well-nourished and well-developed.  Head:  Normocephalic and atraumatic. Eyes:  Without icterus, sclera clear and conjunctiva pink.  Ears:  Normal auditory acuity. Nose:  No deformity, discharge,  or lesions. Mouth:  No deformity or lesions, oral mucosa pink.  Lungs:  Clear to auscultation bilaterally. No wheezes, rales, or rhonchi. No distress.  Heart:  S1, S2 present without murmurs appreciated.  Abdomen:  +BS, soft, non-tender and non-distended. No HSM noted. No guarding or rebound. No masses appreciated.  Rectal:  Deferred  Msk:  Symmetrical without gross deformities. Normal posture. Extremities:  Without edema. Neurologic:  Alert and  oriented x4 Skin:  Intact without significant lesions or rashes. Psych:  Alert and cooperative. Normal mood and affect.

## 2017-12-24 NOTE — Patient Instructions (Signed)
We have scheduled you for a colonoscopy with Dr. Jena Gauss in the near future.  If you have more than 3 watery stools per day, abdominal pain, fever, etc, call us. Otherwise, I feel this is your normal baseline and likely diet-related/stress-related.   It was a pleasure to see you today. I strive to create trusting relationships with patients to provide genuine, compassionate, and quality care. I value your feedback. If you receive a survey regarding your visit,  I greatly appreciate you taking time to fill this out.   Gelene Mink, PhD, ANP-BC [facility]

## 2017-12-25 NOTE — Progress Notes (Signed)
cc'ed to pcp °

## 2018-01-14 DIAGNOSIS — Z1231 Encounter for screening mammogram for malignant neoplasm of breast: Secondary | ICD-10-CM | POA: Diagnosis not present

## 2018-01-14 LAB — HM MAMMOGRAPHY

## 2018-01-20 ENCOUNTER — Encounter: Payer: Self-pay | Admitting: Pediatrics

## 2018-01-20 ENCOUNTER — Ambulatory Visit: Payer: 59 | Admitting: Pediatrics

## 2018-01-20 VITALS — BP 122/83 | HR 66 | Temp 96.7°F | Ht 63.0 in | Wt 158.6 lb

## 2018-01-20 DIAGNOSIS — F429 Obsessive-compulsive disorder, unspecified: Secondary | ICD-10-CM | POA: Diagnosis not present

## 2018-01-20 DIAGNOSIS — F339 Major depressive disorder, recurrent, unspecified: Secondary | ICD-10-CM | POA: Diagnosis not present

## 2018-01-20 DIAGNOSIS — F419 Anxiety disorder, unspecified: Secondary | ICD-10-CM

## 2018-01-20 MED ORDER — SERTRALINE HCL 50 MG PO TABS
50.0000 mg | ORAL_TABLET | Freq: Every day | ORAL | 3 refills | Status: DC
Start: 2018-01-20 — End: 2018-02-10

## 2018-01-20 NOTE — Progress Notes (Signed)
Subjective:   Patient ID: Megan Hicks, female    DOB: 02/27/1965, 52 y.o.   MRN: [id number] CC: Medical Management of Chronic Issues  HPI: Megan Hicks is a 52 y.o. female   Depression: She was diagnosed over 10 years ago.  At that time started on sertraline, worked up to 200 mg, plans to add another agent.  At that time was following with psychiatry.  Psychiatrist got sick and was no longer able to see her.  Her symptoms started getting better.  She tapered off of sertraline completely about a year ago.  Has not been on any medicines for depression, anxiety or OCD history for the last year.  Symptoms have been worsening over the last few weeks.  For the last 2 weeks she has been crying uncontrollably most days.  Her husband has significant health problems and was recently admitted to the hospital with life-threatening illness.  Work is very stressful, has been making symptoms worse.  She gets very anxious in the mornings before going to work, to the point of throwing up at times.  She is very good at her job, recently she has had times where her hands start shaking and she feels very anxious.  She has had passive thoughts about wishing she were not here or not caring if she got in a car accident and did not have to worry anymore.  She has no plans to hurt her self, has not had any thoughts of self-harm. She feels safe driving.  She has an appointment to see a counselor next week.  There is a psychiatrist present at the counselors office that she is hoping to get into be seen.  She is planning to follow with the [facility].  [facility] 01/20/2018 09/05/2017 08/29/2017 12/11/2016 01/16/2016  Decreased Interest 3 0 0 0 0  Down, Depressed, Hopeless 3 0 1 0 0  PHQ - 2 Score 6 0 1 0 0  Altered sleeping 3 - - - -  Tired, decreased energy 3 - - - -  Change in appetite 3 - - - -  Feeling bad or failure about yourself  3 - - - -  Trouble concentrating 3 - - - -    Moving slowly or fidgety/restless 3 - - - -  Suicidal thoughts 3 - - - -  PHQ-9 Score 27 - - - -  Difficult doing work/chores Very difficult - - - -     Relevant past medical, surgical, family and social history reviewed. Allergies and medications reviewed and updated. Social History   Tobacco Use  Smoking Status Never Smoker  Smokeless Tobacco Never Used   ROS: Per HPI   Objective:    BP 122/83   Pulse 66   Temp (!) 96.7 F (35.9 C) (Oral)   Ht 5\' 3"  (1.6 m)   Wt 158 lb 9.6 oz (71.9 kg)   BMI 28.09 kg/m   Wt Readings from Last 3 Encounters:  01/20/18 158 lb 9.6 oz (71.9 kg)  12/24/17 157 lb 9.6 oz (71.5 kg)  09/05/17 166 lb 12.8 oz (75.7 kg)    Gen: NAD, alert, cooperative with exam, NCAT EYES: EOMI, no conjunctival injection, or no icterus CV: NRRR, normal S1/S2, no murmur, distal pulses 2+ b/l Resp: CTABL, no wheezes, normal WOB Abd: +BS, soft, NTND.  Ext: No edema, warm Neuro: Alert and oriented, strength equal b/l UE and LE, coordination grossly normal MSK: normal muscle bulk Psych: + Passive suicidal  ideation.  Tearful at times.  Mood is down.  Assessment & Plan:  Megan Hicks was seen today for medical management of chronic issues.  Diagnoses and all orders for this visit:  Depression, recurrent (HCC) Worsening symptoms, + passive suicidal ideation.  Will start sertraline.  Take half tab for 4 days, then take full tab.  Follow-up with counselor next week as scheduled.  Any worsening symptoms, patient says she feels comfortable letting her husband know or coming in here to be seen.  Due to significant worsening of symptoms on work days, recommend she stays out of work for 30 days while she gets in to see the counselor, get started back on a medicine, and hopefully gets into see a psychiatrist through the counseling office.  Letter given to patient to take to work, will bring [place] paperwork to fill out.  -     sertraline (ZOLOFT) 50 MG tablet; Take 1 tablet (50 mg  total) by mouth daily.  Anxiety Sertraline and counseling as above. -     sertraline (ZOLOFT) 50 MG tablet; Take 1 tablet (50 mg total) by mouth daily.  Obsessive-compulsive disorder, unspecified type Start sertraline and counseling as above. -     sertraline (ZOLOFT) 50 MG tablet; Take 1 tablet (50 mg total) by mouth daily.   Follow up plan: Return in about 3 weeks (around 02/10/2018). Rex Kras, MD [facility]

## 2018-01-20 NOTE — Patient Instructions (Signed)
Take half tab sertraline for 4 days, then take full tab.

## 2018-01-24 DIAGNOSIS — Z026 Encounter for examination for insurance purposes: Secondary | ICD-10-CM

## 2018-01-29 ENCOUNTER — Ambulatory Visit (HOSPITAL_COMMUNITY)
Admission: RE | Admit: 2018-01-29 | Discharge: 2018-01-29 | Disposition: A | Discharge status: Home / Self Care | Payer: 59 | Source: Ambulatory Visit | Attending: Internal Medicine | Admitting: Internal Medicine

## 2018-01-29 ENCOUNTER — Encounter (HOSPITAL_COMMUNITY)
Admission: RE | Disposition: A | Discharge status: Home / Self Care | Payer: Self-pay | Source: Ambulatory Visit | Attending: Internal Medicine

## 2018-01-29 ENCOUNTER — Encounter (HOSPITAL_COMMUNITY): Payer: Self-pay | Admitting: *Deleted

## 2018-01-29 ENCOUNTER — Other Ambulatory Visit: Payer: Self-pay

## 2018-01-29 DIAGNOSIS — F329 Major depressive disorder, single episode, unspecified: Secondary | ICD-10-CM | POA: Diagnosis not present

## 2018-01-29 DIAGNOSIS — Z1211 Encounter for screening for malignant neoplasm of colon: Secondary | ICD-10-CM | POA: Insufficient documentation

## 2018-01-29 DIAGNOSIS — G43909 Migraine, unspecified, not intractable, without status migrainosus: Secondary | ICD-10-CM | POA: Diagnosis not present

## 2018-01-29 DIAGNOSIS — E039 Hypothyroidism, unspecified: Secondary | ICD-10-CM | POA: Insufficient documentation

## 2018-01-29 DIAGNOSIS — F419 Anxiety disorder, unspecified: Secondary | ICD-10-CM | POA: Insufficient documentation

## 2018-01-29 DIAGNOSIS — Z79899 Other long term (current) drug therapy: Secondary | ICD-10-CM | POA: Insufficient documentation

## 2018-01-29 HISTORY — DX: Personal history of urinary calculi: Z87.442

## 2018-01-29 HISTORY — DX: Hypothyroidism, unspecified: E03.9

## 2018-01-29 HISTORY — PX: COLONOSCOPY: SHX5424

## 2018-01-29 HISTORY — DX: Headache: R51

## 2018-01-29 HISTORY — DX: Headache, unspecified: R51.9

## 2018-01-29 SURGERY — COLONOSCOPY
Anesthesia: Moderate Sedation

## 2018-01-29 MED ORDER — STERILE WATER FOR IRRIGATION IR SOLN
Status: DC | PRN
  Administered 2018-01-29: 1.5 mL

## 2018-01-29 MED ORDER — MIDAZOLAM HCL 5 MG/5ML IJ SOLN
INTRAMUSCULAR | Status: AC
  Filled 2018-01-29: qty 10

## 2018-01-29 MED ORDER — ONDANSETRON HCL 4 MG/2ML IJ SOLN
INTRAMUSCULAR | Status: DC | PRN
  Administered 2018-01-29: 4 mg via INTRAVENOUS

## 2018-01-29 MED ORDER — MEPERIDINE HCL 100 MG/ML IJ SOLN
INTRAMUSCULAR | Status: DC | PRN
  Administered 2018-01-29: 15 mg
  Administered 2018-01-29: 10 mg
  Administered 2018-01-29: 25 mg

## 2018-01-29 MED ORDER — SODIUM CHLORIDE 0.9 % IV SOLN
INTRAVENOUS | Status: DC
  Administered 2018-01-29: 1000 mL via INTRAVENOUS

## 2018-01-29 MED ORDER — MIDAZOLAM HCL 5 MG/5ML IJ SOLN
INTRAMUSCULAR | Status: DC | PRN
  Administered 2018-01-29: 1 mg via INTRAVENOUS
  Administered 2018-01-29: 2 mg via INTRAVENOUS
  Administered 2018-01-29 (×4): 1 mg via INTRAVENOUS

## 2018-01-29 MED ORDER — ONDANSETRON HCL 4 MG/2ML IJ SOLN
INTRAMUSCULAR | Status: AC
  Filled 2018-01-29: qty 2

## 2018-01-29 MED ORDER — MEPERIDINE HCL 50 MG/ML IJ SOLN
INTRAMUSCULAR | Status: AC
  Filled 2018-01-29: qty 1

## 2018-01-29 NOTE — Op Note (Signed)
[facility] Patient Name: Megan Hicks Procedure Date: 01/29/2018 2:40 PM MRN: [id number] Date of Birth: 06/12/1965 Attending MD: Gennette Pacobert Michael Kwasi Joung , MD CSN: [id number] Age: [age] Admit Type: Outpatient Procedure:                Colonoscopy Indications:              Screening for colorectal malignant neoplasm Providers:                Gennette Pacobert Michael Angelgabriel Willmore, MD, Sterling Bigiffani Roberts, RN,                            Dyann Ruddleonya Wilson Referring MD:              Medicines:                Midazolam 7 mg IV, Meperidine 50 mg IV, Ondansetron                            4 mg IV Complications:            No immediate complications. Estimated Blood Loss:     Estimated blood loss: none. Procedure:                Pre-Anesthesia Assessment:                           - Prior to the procedure, a History and Physical                            was performed, and patient medications and                            allergies were reviewed. The patient's tolerance of                            previous anesthesia was also reviewed. The risks                            and benefits of the procedure and the sedation                            options and risks were discussed with the patient.                            All questions were answered, and informed consent                            was obtained. Prior Anticoagulants: The patient has                            taken no previous anticoagulant or antiplatelet                            agents. ASA Grade Assessment: II - A patient with  mild systemic disease. After reviewing the risks                            and benefits, the patient was deemed in                            satisfactory condition to undergo the procedure.                           After obtaining informed consent, the colonoscope                            was passed under direct vision. Throughout the                            procedure, the patient's  blood pressure, pulse, and                            oxygen saturations were monitored continuously. The                            CF-[id number] ([id number]) scope was introduced through                            the anus and advanced to the the cecum, identified                            by appendiceal orifice and ileocecal valve. The                            colonoscopy was performed without difficulty. The                            patient tolerated the procedure well. The quality                            of the bowel preparation was adequate. The                            ileocecal valve, appendiceal orifice, and rectum                            were photographed. The ileocecal valve, appendiceal                            orifice, and rectum were photographed. The entire                            colon was well visualized. Scope In: 3:12:02 PM Scope Out: 3:29:33 PM Scope Withdrawal Time: 0 hours 6 minutes 35 seconds  Total Procedure Duration: 0 hours 17 minutes 31 seconds  Findings:      The perianal and digital rectal examinations were normal.      The colon (entire examined portion) appeared normal.  The retroflexed view of the distal rectum and anal verge was normal and       showed no anal or rectal abnormalities. Impression:               - The entire examined colon is normal.                           - The distal rectum and anal verge are normal on                            retroflexion view.                           - No specimens collected. Moderate Sedation:      Moderate (conscious) sedation was administered by the endoscopy nurse       and supervised by the endoscopist. The following parameters were       monitored: oxygen saturation, heart rate, blood pressure, and response       to care. Total physician intraservice time was 24 minutes. Recommendation:           - Patient has a contact number available for                            emergencies. The signs  and symptoms of potential                            delayed complications were discussed with the                            patient. Return to normal activities tomorrow.                            Written discharge instructions were provided to the                            patient.                           - Resume previous diet.                           - Continue present medications.                           - Repeat colonoscopy in 10 years for screening                            purposes.                           - Return to GI office PRN. Procedure Code(s):        --- Professional ---                           [phone], Colonoscopy, flexible; diagnostic, including  collection of specimen(s) by brushing or washing,                            when performed (separate procedure)                           99153, Moderate sedation; each additional 15                            minutes intraservice time                           G0500, Moderate sedation services provided by the                            same physician or other qualified health care                            professional performing a gastrointestinal                            endoscopic service that sedation supports,                            requiring the presence of an independent trained                            observer to assist in the monitoring of the                            patient's level of consciousness and physiological                            status; initial 15 minutes of intra-service time;                            patient age 55 years or older (additional time may                            be reported with 47829, as appropriate) Diagnosis Code(s):        --- Professional ---                           Z12.11, Encounter for screening for malignant                            neoplasm of colon CPT copyright 2018 American Medical Association. All rights  reserved. The codes documented in this report are preliminary and upon coder review may  be revised to meet current compliance requirements. Gerrit Friends. Chaniece Barbato, MD Gennette Pac, MD 01/29/2018 3:35:52 PM This report has been signed electronically. Number of Addenda: 0

## 2018-01-29 NOTE — H&P (Signed)
 @LOGO @   Primary Care Physician:  Megan Hicks, Megan Hicks, Megan Hicks Primary Gastroenterologist:  Dr. Jena Gaussourk  Pre-Procedure History & Physical: HPI:  Megan Hicks is a 52 y.o. female is here for a screening colonoscopy.  No bowel symptoms.  No family history of colon cancer.  No prior colonoscopy.  Past Medical History:  Diagnosis Date  . Anxiety   . Depression   . Headache    migraines  . History of kidney stones   . Hypothyroidism   . Thyroid disease     Past Surgical History:  Procedure Laterality Date  . None      Prior to Admission medications   Medication Sig Start Date End Date Taking? Authorizing Provider  Aspirin-Acetaminophen-Caffeine (EXCEDRIN PO) Take 1 tablet by mouth every 6 (six) hours as needed (headache).    Yes Provider, Historical, Megan Hicks  levothyroxine (SYNTHROID, LEVOTHROID) 25 MCG tablet Take 1 Tablet by mouth once daily before breakfast 09/05/17  Yes Megan Hicks, Megan Hicks, Megan Hicks  sertraline (ZOLOFT) 50 MG tablet Take 1 tablet (50 mg total) by mouth daily. 01/20/18  Yes Megan Hicks, Megan Hicks, Megan Hicks  promethazine (PHENERGAN) 25 MG tablet Take 1 tablet (25 mg total) by mouth every 8 (eight) hours as needed for nausea or vomiting. 08/29/17   Megan Hicks, Megan Hicks, Megan Hicks  rizatriptan (MAXALT) 5 MG tablet Take 1 tablet (5 mg total) by mouth as needed for migraine. May repeat in 2 hours if needed 08/29/17   Megan Hicks, Megan Hicks, Megan Hicks  valACYclovir (VALTREX) 500 MG tablet Take 4 tabs twice a day for 1 day as needed Patient taking differently: Take 500 mg by mouth daily as needed (fever blisters). Take 4 tabs twice a day for 1 day as needed 09/05/17   Megan Hicks, Megan Hicks, Megan Hicks    Allergies as of 12/24/2017  . (No Known Allergies)    Family History  Problem Relation Age of Onset  . Arthritis Mother   . Hearing loss Mother   . Hypertension Mother   . Arthritis Father   . Diabetes Father   . Colon cancer Neg Hx   . Colon polyps Neg Hx     Social History   Socioeconomic History  . Marital status: Married     Spouse name: Not on file  . Number of children: Not on file  . Years of education: Not on file  . Highest education level: Not on file  Occupational History  . Not on file  Social Needs  . Financial resource strain: Not on file  . Food insecurity:    Worry: Not on file    Inability: Not on file  . Transportation needs:    Medical: Not on file    Non-medical: Not on file  Tobacco Use  . Smoking status: Never Smoker  . Smokeless tobacco: Never Used  Substance and Sexual Activity  . Alcohol use: No  . Drug use: No  . Sexual activity: Not on file  Lifestyle  . Physical activity:    Days per week: Not on file    Minutes per session: Not on file  . Stress: Not on file  Relationships  . Social connections:    Talks on phone: Not on file    Gets together: Not on file    Attends religious service: Not on file    Active member of club or organization: Not on file    Attends meetings of clubs or organizations: Not on file    Relationship status: Not on file  .  Intimate partner violence:    Fear of current or ex partner: Not on file    Emotionally abused: Not on file    Physically abused: Not on file    Forced sexual activity: Not on file  Other Topics Concern  . Not on file  Social History Narrative  . Not on file    Review of Systems: See HPI, otherwise negative ROS  Physical Exam: BP 135/75   Pulse (!) 58   Temp (!) 97.4 F (36.3 C) (Oral)   Resp 13   Ht 5\' 3"  (1.6 m)   Wt 71.7 kg   LMP 01/08/2018 (Approximate)   SpO2 100%   BMI 27.99 kg/m  General:   Alert,  Well-developed, well-nourished, pleasant and cooperative in NAD Lungs:  Clear throughout to auscultation.   No wheezes, crackles, or rhonchi. No acute distress. Heart:  Regular rate and rhythm; no murmurs, clicks, rubs,  or gallops. Abdomen:  Soft, nontender and nondistended. No masses, hepatosplenomegaly or hernias noted. Normal bowel sounds, without guarding, and without rebound.     Impression/Plan: Megan Hicks is now here to undergo a screening colonoscopy.  First ever average risk screening examination.  Risks, benefits, limitations, imponderables and alternatives regarding colonoscopy have been reviewed with the patient. Questions have been answered. All parties agreeable.     Notice:  This dictation was prepared with Dragon dictation along with smaller phrase technology. Any transcriptional errors that result from this process are unintentional and may not be corrected upon review.

## 2018-01-29 NOTE — Discharge Instructions (Signed)

## 2018-02-04 ENCOUNTER — Encounter (HOSPITAL_COMMUNITY): Payer: Self-pay | Admitting: Internal Medicine

## 2018-02-10 ENCOUNTER — Ambulatory Visit: Payer: 59 | Admitting: Pediatrics

## 2018-02-10 ENCOUNTER — Encounter: Payer: Self-pay | Admitting: Pediatrics

## 2018-02-10 DIAGNOSIS — F339 Major depressive disorder, recurrent, unspecified: Secondary | ICD-10-CM

## 2018-02-10 DIAGNOSIS — F419 Anxiety disorder, unspecified: Secondary | ICD-10-CM | POA: Diagnosis not present

## 2018-02-10 DIAGNOSIS — F429 Obsessive-compulsive disorder, unspecified: Secondary | ICD-10-CM

## 2018-02-10 MED ORDER — SERTRALINE HCL 100 MG PO TABS: 100.0000 mg | ORAL_TABLET | Freq: Every day | ORAL | 1 refills | Status: DC

## 2018-02-10 NOTE — Progress Notes (Signed)
Subjective:   Patient ID: Megan Hicks, female    DOB: 08/12/1965, 52 y.o.   MRN: [id number] CC: Medical Management of Chronic Issues  HPI: Megan Hicks is a 52 y.o. female   Seen 3 weeks ago for worsening anxiety and depression.  See prior notes.  Since last visit, was able to follow-up with a counselor.  Has another appointment coming in 2 weeks, will also be seeing a psychiatrist that day.  Was started on sertraline at last visit.  Has been on before.  She has noticed improvement in her mood since starting.  She no longer has any thoughts about wishing she were not here.  She does have ongoing symptoms of depression.  Husband has been good support.  She has been out of work for the last 3 weeks.  Work is been separate ongoing stressor, was having vomiting every day before going to work.  Vomiting has stopped.  She is interested in returning to work, would like symptoms to be better controlled on medicines before return.    [facility] 2/9 02/10/2018 01/20/2018 09/05/2017 08/29/2017 12/11/2016  Decreased Interest 3 3 0 0 0  Down, Depressed, Hopeless 3 3 0 1 0  PHQ - 2 Score 6 6 0 1 0  Altered sleeping 2 3 - - -  Tired, decreased energy 2 3 - - -  Change in appetite 2 3 - - -  Feeling bad or failure about yourself  3 3 - - -  Trouble concentrating 2 3 - - -  Moving slowly or fidgety/restless 3 3 - - -  Suicidal thoughts 2 3 - - -  PHQ-9 Score 22 27 - - -  Difficult doing work/chores Very difficult Very difficult - - -     Relevant past medical, surgical, family and social history reviewed. Allergies and medications reviewed and updated. Social History   Tobacco Use  Smoking Status Never Smoker  Smokeless Tobacco Never Used   ROS: Per HPI   Objective:    BP 125/85   Pulse 84   Temp (!) 96.8 F (36 C) (Oral)   Ht 5\' 3"  (1.6 m)   Wt 157 lb (71.2 kg)   BMI 27.81 kg/m   Wt Readings from Last 3 Encounters:  02/10/18 157 lb (71.2 kg)  01/29/18 158 lb (71.7  kg)  01/20/18 158 lb 9.6 oz (71.9 kg)   Gen: NAD, alert, cooperative with exam, NCAT EYES: EOMI, no conjunctival injection, or no icterus ENT:  OP without erythema CV: NRRR, normal S1/S2, no murmur, distal pulses 2+ b/l Resp: CTABL, no wheezes, normal WOB Ext: No edema, warm Neuro: Alert and oriented MSK: normal muscle bulk  Assessment & Plan:  Megan Hicks was seen today for medical management of chronic issues.  Diagnoses and all orders for this visit:  Depression, recurrent (HCC) PHQ improved, still very elevated.  Ongoing symptoms, She has been improving, more improvement necessary and better stability on medication prior to return to work. Recommend remaining out of work additional thirty days. Will follow up with me week prior to return to work.  Has appointment to see counselor and psychiatry coming up next couple weeks.  Will increase sertraline to 100 mg today.  Return precautions discussed.  Feels safe at home.  Husband has been good support. -     sertraline (ZOLOFT) 100 MG tablet; Take 1 tablet (100 mg total) by mouth daily.  Anxiety -     sertraline (ZOLOFT) 100 MG tablet; Take  1 tablet (100 mg total) by mouth daily.  Obsessive-compulsive disorder, unspecified type -     sertraline (ZOLOFT) 100 MG tablet; Take 1 tablet (100 mg total) by mouth daily.   Follow up plan: Return in about 4 weeks (around 03/10/2018). Rex Krasarol , MD [facility]

## 2018-03-12 ENCOUNTER — Ambulatory Visit: Payer: 59 | Admitting: Family Medicine

## 2018-03-12 ENCOUNTER — Encounter: Payer: Self-pay | Admitting: Family Medicine

## 2018-03-12 ENCOUNTER — Ambulatory Visit: Payer: 59 | Admitting: Pediatrics

## 2018-03-12 VITALS — BP 135/84 | HR 67 | Temp 97.3°F | Ht 63.0 in | Wt 154.0 lb

## 2018-03-12 DIAGNOSIS — F331 Major depressive disorder, recurrent, moderate: Secondary | ICD-10-CM

## 2018-03-12 NOTE — Patient Instructions (Signed)

## 2018-03-12 NOTE — Progress Notes (Signed)
    Subjective:   Megan Hicks is an 53 y.o. female who presents for follow up of depressive symptoms.  Onset approximately 10 years ago, waxing and waining since that time. She has greatly improved since her last visit. She is currently seeing a counselor and psychiatrist. She reports they placed her on Rexulti. She states this has been very beneficial.  Current symptoms include fatigue, anxiety, increased appetite,.  Current treatment for depression:Individual therapy and Medication Sleep problems: Mild   Early awakening:Absent   Energy: Good Motivation: Good Concentration: Good Rumination/worrying: Mild Memory: Good Tearfulness: Mild  Anxiety: Mild  Panic: Absent  Overall Mood: Greatly improved  Hopelessness: Mild Suicidal ideation: Absent  Other/Psychosocial Stressors: concerned about going back to work.  Family history positive for depression in the patient's maternal aunt.  Previous treatment modalities employed include Medication.  Past episodes of depression:yes, managed with medications Organic causes of depression present: None.  Review of Systems Pertinent items are noted in HPI.   Depression screen [facility] 2/9 03/12/2018 02/10/2018 01/20/2018 09/05/2017 08/29/2017  Decreased Interest 2 3 3  0 0  Down, Depressed, Hopeless 2 3 3  0 1  PHQ - 2 Score 4 6 6  0 1  Altered sleeping 2 2 3  - -  Tired, decreased energy 1 2 3  - -  Change in appetite 0 2 3 - -  Feeling bad or failure about yourself  2 3 3  - -  Trouble concentrating 1 2 3  - -  Moving slowly or fidgety/restless 1 3 3  - -  Suicidal thoughts 0 2 3 - -  PHQ-9 Score 11 22 27  - -  Difficult doing work/chores - Very difficult Very difficult - -     Objective:   Mental Status Examination: Posture and motor behavior: Appropriate Dress, grooming, personal hygiene: Appropriate Facial expression: Appropriate Speech: Appropriate Mood: Appropriate Coherency and relevance of thought: Appropriate Thought content:  Appropriate Perceptions: Appropriate Orientation:Appropriate Attention and concentration: Appropriate Memory: : Appropriate Vocabulary: Appropriate Abstract reasoning: Appropriate Judgment: Appropriate    Assessment:    Megan Hicks was seen today for depression/anxiety followup.  Diagnoses and all orders for this visit:  Moderate episode of recurrent major depressive disorder (HCC) Continue medications as prescribed. Continue counseling.   Experiencing the following symptoms of depression most of the day nearly every day for more than two consecutive weeks: depressed mood, loss of interests/pleasure, change in sleep, change in appetite or weight, change in psychomotor activity, loss of energy, trouble concentrating, thoughts of worthlessness or guilt   Suicide Risk Assessment: Suicidal intent: no Suicidal plan: no  Plan:   Moderate episode of recurrent major depressive disorder (HCC)  Reviewed concept of depression as biochemical imbalance of neurotransmitters and rationale for treatment. Instructed patient to contact office or on-call physician promptly should condition worsen or any new symptoms appear and provided on-call telephone numbers.    The above assessment and management plan was discussed with the patient. The patient verbalized understanding of and has agreed to the management plan. Patient is aware to call the clinic if symptoms fail to improve or worsen. Patient is aware when to return to the clinic for a follow-up visit. Patient educated on when it is appropriate to go to the emergency department.   Kari Baars, FNP-C [facility] [address] [place], [place] [address] [phone]

## 2018-03-13 DIAGNOSIS — M25511 Pain in right shoulder: Secondary | ICD-10-CM | POA: Diagnosis not present

## 2018-04-29 DIAGNOSIS — M25561 Pain in right knee: Secondary | ICD-10-CM | POA: Diagnosis not present

## 2018-04-29 DIAGNOSIS — M25361 Other instability, right knee: Secondary | ICD-10-CM | POA: Diagnosis not present

## 2018-05-13 DIAGNOSIS — M25561 Pain in right knee: Secondary | ICD-10-CM | POA: Diagnosis not present

## 2018-06-12 ENCOUNTER — Ambulatory Visit: Payer: 59 | Admitting: Family Medicine

## 2018-07-10 ENCOUNTER — Other Ambulatory Visit: Payer: Self-pay

## 2018-07-10 ENCOUNTER — Ambulatory Visit: Payer: 59 | Admitting: Family Medicine

## 2018-07-10 ENCOUNTER — Encounter: Payer: Self-pay | Admitting: Family Medicine

## 2018-07-10 VITALS — BP 131/84 | HR 65 | Temp 98.1°F | Ht 63.0 in | Wt 168.0 lb

## 2018-07-10 DIAGNOSIS — E039 Hypothyroidism, unspecified: Secondary | ICD-10-CM | POA: Diagnosis not present

## 2018-07-10 DIAGNOSIS — F331 Major depressive disorder, recurrent, moderate: Secondary | ICD-10-CM | POA: Diagnosis not present

## 2018-07-10 MED ORDER — LEVOTHYROXINE SODIUM 25 MCG PO TABS: 25.0000 ug | ORAL_TABLET | Freq: Every day | ORAL | 3 refills | Status: AC

## 2018-07-10 NOTE — Patient Instructions (Signed)
Hypothyroidism  Hypothyroidism is when the thyroid gland does not make enough of certain hormones (it is underactive). The thyroid gland is a small gland located in the lower front part of the neck, just in front of the windpipe (trachea). This gland makes hormones that help control how the body uses food for energy (metabolism) as well as how the heart and brain function. These hormones also play a role in keeping your bones strong. When the thyroid is underactive, it produces too little of the hormones thyroxine (T4) and triiodothyronine (T3). What are the causes? This condition may be caused by:  Hashimoto's disease. This is a disease in which the body's disease-fighting system (immune system) attacks the thyroid gland. This is the most common cause.  Viral infections.  Pregnancy.  Certain medicines.  Birth defects.  Past radiation treatments to the head or neck for cancer.  Past treatment with radioactive iodine.  Past exposure to radiation in the environment.  Past surgical removal of part or all of the thyroid.  Problems with a gland in the center of the brain (pituitary gland).  Lack of enough iodine in the diet. What increases the risk? You are more likely to develop this condition if:  You are female.  You have a family history of thyroid conditions.  You use a medicine called lithium.  You take medicines that affect the immune system (immunosuppressants). What are the signs or symptoms? Symptoms of this condition include:  Feeling as though you have no energy (lethargy).  Not being able to tolerate cold.  Weight gain that is not explained by a change in diet or exercise habits.  Lack of appetite.  Dry skin.  Coarse hair.  Menstrual irregularity.  Slowing of thought processes.  Constipation.  Sadness or depression. How is this diagnosed? This condition may be diagnosed based on:  Your symptoms, your medical history, and a physical exam.  Blood  tests. You may also have imaging tests, such as an ultrasound or MRI. How is this treated? This condition is treated with medicine that replaces the thyroid hormones that your body does not make. After you begin treatment, it may take several weeks for symptoms to go away. Follow these instructions at home:  Take over-the-counter and prescription medicines only as told by your health care provider.  If you start taking any new medicines, tell your health care provider.  Keep all follow-up visits as told by your health care provider. This is important. ? As your condition improves, your dosage of thyroid hormone medicine may change. ? You will need to have blood tests regularly so that your health care provider can monitor your condition. Contact a health care provider if:  Your symptoms do not get better with treatment.  You are taking thyroid replacement medicine and you: ? Sweat a lot. ? Have tremors. ? Feel anxious. ? Lose weight rapidly. ? Cannot tolerate heat. ? Have emotional swings. ? Have diarrhea. ? Feel weak. Get help right away if you have:  Chest pain.  An irregular heartbeat.  A rapid heartbeat.  Difficulty breathing. Summary  Hypothyroidism is when the thyroid gland does not make enough of certain hormones (it is underactive).  When the thyroid is underactive, it produces too little of the hormones thyroxine (T4) and triiodothyronine (T3).  The most common cause is Hashimoto's disease, a disease in which the body's disease-fighting system (immune system) attacks the thyroid gland. The condition can also be caused by viral infections, medicine, pregnancy, or past   radiation treatment to the head or neck.  Symptoms may include weight gain, dry skin, constipation, feeling as though you do not have energy, and not being able to tolerate cold.  This condition is treated with medicine to replace the thyroid hormones that your body does not make. This information  is not intended to replace advice given to you by your health care provider. Make sure you discuss any questions you have with your health care provider. Document Released: 02/05/2005 Document Revised: 01/16/2017 Document Reviewed: 01/16/2017 Elsevier Interactive Patient Education  2019 Elsevier Inc[employer]  

## 2018-07-10 NOTE — Progress Notes (Signed)
Subjective:  Patient ID: Megan Hicks, female    DOB: 1965/03/02, 53 y.o.   MRN: [id number]  Chief Complaint:  Hypothyroidism   HPI: Megan Hicks is a 53 y.o. female presenting on 07/10/2018 for Hypothyroidism   1. Moderate episode of recurrent major depressive disorder (HCC)  Pt states she is doing much better. States she is followed by psychiatry and sees a Veterinary surgeon. Pt states she has adjusted her medications and she is doing much better. Denies SI or HI. Is compliant with medications without associated side effects.  Depression screen [facility] 2/9 07/10/2018 03/12/2018 02/10/2018 01/20/2018 09/05/2017  Decreased Interest 2 2 3 3  0  Down, Depressed, Hopeless 2 2 3 3  0  PHQ - 2 Score 4 4 6 6  0  Altered sleeping 2 2 2 3  -  Tired, decreased energy 2 1 2 3  -  Change in appetite 0 0 2 3 -  Feeling bad or failure about yourself  2 2 3 3  -  Trouble concentrating - 1 2 3  -  Moving slowly or fidgety/restless - 1 3 3  -  Suicidal thoughts 0 0 2 3 -  PHQ-9 Score 10 11 22 27  -  Difficult doing work/chores Very difficult - Very difficult Very difficult -     2. Acquired hypothyroidism  Doing well on 25 mcg daily. States she has been on this dose for several years. Denies fatigue, mood swings, weight changes, hair changes, skin dryness, nail changes, palpitations, constipation, heat or cold intolerance, or tremors.      Relevant past medical, surgical, family, and social history reviewed and updated as indicated.  Allergies and medications reviewed and updated.   Past Medical History:  Diagnosis Date  . Anxiety   . Depression   . Headache    migraines  . History of kidney stones   . Hypothyroidism   . Thyroid disease     Past Surgical History:  Procedure Laterality Date  . COLONOSCOPY N/A 01/29/2018   Procedure: COLONOSCOPY;  Surgeon: Corbin Ade, MD;  Location: [facility] SU[facility];  Service: Endoscopy;  Laterality: N/A;  2:45pm  . None      Social History    Socioeconomic History  . Marital status: Married    Spouse name: Not on file  . Number of children: Not on file  . Years of education: Not on file  . Highest education level: Not on file  Occupational History  . Not on file  Social Needs  . Financial resource strain: Not on file  . Food insecurity:    Worry: Not on file    Inability: Not on file  . Transportation needs:    Medical: Not on file    Non-medical: Not on file  Tobacco Use  . Smoking status: Never Smoker  . Smokeless tobacco: Never Used  Substance and Sexual Activity  . Alcohol use: No  . Drug use: No  . Sexual activity: Not on file  Lifestyle  . Physical activity:    Days per week: Not on file    Minutes per session: Not on file  . Stress: Not on file  Relationships  . Social connections:    Talks on phone: Not on file    Gets together: Not on file    Attends religious service: Not on file    Active member of club or organization: Not on file    Attends meetings of clubs or organizations: Not on file    Relationship status:  Not on file  . Intimate partner violence:    Fear of current or ex partner: Not on file    Emotionally abused: Not on file    Physically abused: Not on file    Forced sexual activity: Not on file  Other Topics Concern  . Not on file  Social History Narrative  . Not on file    Outpatient Encounter Medications as of 07/10/2018  Medication Sig  . Aspirin-Acetaminophen-Caffeine (EXCEDRIN PO) Take 1 tablet by mouth every 6 (six) hours as needed (headache).   . Brexpiprazole (REXULTI PO) Take by mouth.  . levothyroxine (SYNTHROID) 25 MCG tablet Take 1 tablet (25 mcg total) by mouth daily. Take 1 Tablet by mouth once daily before breakfast  . promethazine (PHENERGAN) 25 MG tablet Take 1 tablet (25 mg total) by mouth every 8 (eight) hours as needed for nausea or vomiting.  . rizatriptan (MAXALT) 5 MG tablet Take 1 tablet (5 mg total) by mouth as needed for migraine. May repeat in 2 hours if  needed  . sertraline (ZOLOFT) 100 MG tablet Take 150 mg by mouth daily.  . valACYclovir (VALTREX) 500 MG tablet Take 4 tabs twice a day for 1 day as needed (Patient taking differently: Take 500 mg by mouth daily as needed (fever blisters). Take 4 tabs twice a day for 1 day as needed)  . [DISCONTINUED] levothyroxine (SYNTHROID, LEVOTHROID) 25 MCG tablet Take 1 Tablet by mouth once daily before breakfast (Patient taking differently: 150 mcg. Take 1 Tablet by mouth once daily before breakfast)  . [DISCONTINUED] sertraline (ZOLOFT) 100 MG tablet Take 1 tablet (100 mg total) by mouth daily.  . naproxen (NAPROSYN) 500 MG tablet naproxen 500 mg tablet  . propranolol (INDERAL) 10 MG tablet propranolol 10 mg tablet  . traZODone (DESYREL) 50 MG tablet trazodone 50 mg tablet   No facility-administered encounter medications on file as of 07/10/2018.     No Known Allergies  Review of Systems  Constitutional: Negative for activity change, appetite change, chills, diaphoresis, fatigue, fever and unexpected weight change.  Eyes: Negative for photophobia and visual disturbance.  Respiratory: Negative for cough, chest tightness and shortness of breath.   Cardiovascular: Negative for chest pain, palpitations and leg swelling.  Gastrointestinal: Negative for abdominal distention, abdominal pain, constipation, diarrhea, nausea and vomiting.  Endocrine: Negative for cold intolerance, heat intolerance, polydipsia, polyphagia and polyuria.  Genitourinary: Negative for menstrual problem.  Musculoskeletal: Negative for arthralgias and myalgias.  Skin: Negative for rash.  Neurological: Negative for dizziness, tremors, syncope, weakness, light-headedness, numbness and headaches.  Psychiatric/Behavioral: Positive for decreased concentration, dysphoric mood and sleep disturbance. Negative for agitation, behavioral problems, confusion, hallucinations, self-injury and suicidal ideas. The patient is not nervous/anxious and  is not hyperactive.   All other systems reviewed and are negative.       Objective:  BP 131/84   Pulse 65   Temp 98.1 F (36.7 C) (Oral)   Ht  (1.6 m)   Wt 168 lb (76.2 kg)   BMI 29.76 kg/m    Wt Readings from Last 3 Encounters:  07/10/18 168 lb (76.2 kg)  03/12/18 154 lb (69.9 kg)  02/10/18 157 lb (71.2 kg)    Physical Exam Vitals signs and nursing note reviewed.  Constitutional:      General: She is not in acute distress.    Appearance: Normal appearance. She is well-developed and well-groomed. She is not ill-appearing or toxic-appearing.  HENT:     Head: Normocephalic and atraumatic.  Right Ear: Hearing normal.     Left Ear: Hearing normal.     Nose: Nose normal.     Mouth/Throat:     Lips: Pink.     Mouth: Mucous membranes are moist.     Pharynx: Oropharynx is clear.  Eyes:     Conjunctiva/sclera: Conjunctivae normal.     Pupils: Pupils are equal, round, and reactive to light.  Neck:     Musculoskeletal: Normal range of motion and neck supple.     Thyroid: No thyroid mass, thyromegaly or thyroid tenderness.     Vascular: No carotid bruit or JVD.     Trachea: Trachea and phonation normal.  Cardiovascular:     Rate and Rhythm: Normal rate and regular rhythm.     Heart sounds: Normal heart sounds. No murmur. No friction rub. No gallop.   Pulmonary:     Effort: Pulmonary effort is normal. No respiratory distress.     Breath sounds: Normal breath sounds.  Abdominal:     General: Bowel sounds are normal.     Palpations: Abdomen is soft.  Skin:    General: Skin is warm.     Capillary Refill: Capillary refill takes less than 2 seconds.  Neurological:     General: No focal deficit present.     Mental Status: She is alert and oriented to person, place, and time.     Cranial Nerves: No cranial nerve deficit.     Sensory: No sensory deficit.     Motor: No weakness.     Coordination: Coordination normal.     Gait: Gait normal.     Deep Tendon Reflexes:  Reflexes normal.  Psychiatric:        Attention and Perception: Attention and perception normal.        Mood and Affect: Mood and affect normal.        Speech: Speech normal.        Behavior: Behavior normal. Behavior is cooperative.        Thought Content: Thought content normal. Thought content does not include homicidal or suicidal ideation. Thought content does not include homicidal or suicidal plan.        Cognition and Memory: Cognition and memory normal.        Judgment: Judgment normal.     Results for orders placed or performed in visit on 01/20/18  [facility] MAMMOGRAPHY  Result Value Ref Range   HM Mammogram 0-4 Bi-Rad 0-4 Bi-Rad, Self Reported Normal       Pertinent labs & imaging results that were available during my care of the patient were reviewed by me and considered in my medical decision making.  Assessment & Plan:  Megan Hicks was seen today for hypothyroidism.  Diagnoses and all orders for this visit:  Moderate episode of recurrent major depressive disorder (HCC) Continue counseling and follow up with psychiatry as scheduled. Report any new or worsening symptoms. Pt aware when to seek emergent care.   Acquired hypothyroidism Labs pending. Will change therapy if warranted.  -     levothyroxine (SYNTHROID) 25 MCG tablet; Take 1 tablet (25 mcg total) by mouth daily. Take 1 Tablet by mouth once daily before breakfast -     TSH   Pt to schedule CPE  Continue all other maintenance medications.  Follow up plan: Return in about 1 month (around 08/10/2018), or if symptoms worsen or fail to improve, for CPE.  Educational handout given for hypothyroidism  The above assessment and management plan was discussed  with the patient. The patient verbalized understanding of and has agreed to the management plan. Patient is aware to call the clinic if symptoms persist or worsen. Patient is aware when to return to the clinic for a follow-up visit. Patient educated on when it is  appropriate to go to the emergency department.   Kari BaarsMichelle Blanch Stang, FNP-C [facility] [phone]

## 2018-07-11 LAB — TSH: TSH: 3.73 u[IU]/mL (ref 0.450–4.500)

## 2018-07-31 ENCOUNTER — Encounter: Payer: Self-pay | Admitting: Family Medicine

## 2018-07-31 ENCOUNTER — Ambulatory Visit (INDEPENDENT_AMBULATORY_CARE_PROVIDER_SITE_OTHER): Payer: 59 | Admitting: Family Medicine

## 2018-07-31 ENCOUNTER — Other Ambulatory Visit: Payer: Self-pay

## 2018-07-31 VITALS — BP 119/78 | HR 68 | Temp 98.2°F | Ht 63.0 in | Wt 168.0 lb

## 2018-07-31 DIAGNOSIS — F339 Major depressive disorder, recurrent, unspecified: Secondary | ICD-10-CM

## 2018-07-31 DIAGNOSIS — Z0001 Encounter for general adult medical examination with abnormal findings: Secondary | ICD-10-CM | POA: Diagnosis not present

## 2018-07-31 DIAGNOSIS — E039 Hypothyroidism, unspecified: Secondary | ICD-10-CM | POA: Diagnosis not present

## 2018-07-31 DIAGNOSIS — E663 Overweight: Secondary | ICD-10-CM | POA: Diagnosis not present

## 2018-07-31 NOTE — Patient Instructions (Signed)

## 2018-07-31 NOTE — Progress Notes (Signed)
Subjective:  Patient ID: Megan Hicks, female    DOB: 1965/12/11, 53 y.o.   MRN: [id number]  Chief Complaint:  Annual Exam   HPI: Megan Hicks is a 53 y.o. female presenting on 07/31/2018 for Annual Exam   1. Encounter for general adult medical examination with abnormal findings  Doing fairly well overall. Continues to see counselor for therapy and this is very beneficial.  Due for mammogram in November of this year. Is active and does try to watch diet. No other complaints or concerns. Will go back to work next week.    2. Overweight (BMI 25.0-29.9)  Is very active. Does try to watch diet. No regular exercise routine.    3. Acquired hypothyroidism  Compliant with medications. No fatigue, weakness, unexplained weight changes, bowel problems, skin or hair changes, nail changes, or heat or cold intolerance.    4. Depression, recurrent ([place])  Continues with therapy. Doing better. Is ready to go back to work next week. No SI or HI. Compliant with medications without associated side effects.  Depression screen [facility] 2/9 07/31/2018 07/10/2018 03/12/2018 02/10/2018 01/20/2018  Decreased Interest '2 2 2 3 3  ' Down, Depressed, Hopeless '2 2 2 3 3  ' PHQ - 2 Score '4 4 4 6 6  ' Altered sleeping '2 2 2 2 3  ' Tired, decreased energy '2 2 1 2 3  ' Change in appetite 0 0 0 2 3  Feeling bad or failure about yourself  0 '2 2 3 3  ' Trouble concentrating 3 - '1 2 3  ' Moving slowly or fidgety/restless 0 - '1 3 3  ' Suicidal thoughts 0 0 0 2 3  PHQ-9 Score '11 10 11 22 27  ' Difficult doing work/chores - Very difficult - Very difficult Very difficult        Relevant past medical, surgical, family, and social history reviewed and updated as indicated.  Allergies and medications reviewed and updated.   Past Medical History:  Diagnosis Date  . Anxiety   . Depression   . Headache    migraines  . History of kidney stones   . Hypothyroidism   . Thyroid disease     Past Surgical History:  Procedure  Laterality Date  . COLONOSCOPY N/A 01/29/2018   Procedure: COLONOSCOPY;  Surgeon: Daneil Dolin, MD;  Location: [facility];  Service: Endoscopy;  Laterality: N/A;  2:45pm  . None      Social History   Socioeconomic History  . Marital status: Married    Spouse name: Not on file  . Number of children: Not on file  . Years of education: Not on file  . Highest education level: Not on file  Occupational History  . Not on file  Social Needs  . Financial resource strain: Not on file  . Food insecurity    Worry: Not on file    Inability: Not on file  . Transportation needs    Medical: Not on file    Non-medical: Not on file  Tobacco Use  . Smoking status: Never Smoker  . Smokeless tobacco: Never Used  Substance and Sexual Activity  . Alcohol use: No  . Drug use: No  . Sexual activity: Not on file  Lifestyle  . Physical activity    Days per week: Not on file    Minutes per session: Not on file  . Stress: Not on file  Relationships  . Social connections    Talks on phone: Not on file  Gets together: Not on file    Attends religious service: Not on file    Active member of club or organization: Not on file    Attends meetings of clubs or organizations: Not on file    Relationship status: Not on file  . Intimate partner violence    Fear of current or ex partner: Not on file    Emotionally abused: Not on file    Physically abused: Not on file    Forced sexual activity: Not on file  Other Topics Concern  . Not on file  Social History Narrative  . Not on file    Outpatient Encounter Medications as of 07/31/2018  Medication Sig  . Aspirin-Acetaminophen-Caffeine (EXCEDRIN PO) Take 1 tablet by mouth every 6 (six) hours as needed (headache).   . Brexpiprazole (REXULTI PO) Take by mouth.  . levothyroxine (SYNTHROID) 25 MCG tablet Take 1 tablet (25 mcg total) by mouth daily. Take 1 Tablet by mouth once daily before breakfast  . naproxen (NAPROSYN) 500 MG tablet naproxen  500 mg tablet  . promethazine (PHENERGAN) 25 MG tablet Take 1 tablet (25 mg total) by mouth every 8 (eight) hours as needed for nausea or vomiting.  . propranolol (INDERAL) 10 MG tablet propranolol 10 mg tablet  . rizatriptan (MAXALT) 5 MG tablet Take 1 tablet (5 mg total) by mouth as needed for migraine. May repeat in 2 hours if needed  . sertraline (ZOLOFT) 100 MG tablet Take 150 mg by mouth daily.  . traZODone (DESYREL) 50 MG tablet trazodone 50 mg tablet  . valACYclovir (VALTREX) 500 MG tablet Take 4 tabs twice a day for 1 day as needed (Patient taking differently: Take 500 mg by mouth daily as needed (fever blisters). Take 4 tabs twice a day for 1 day as needed)   No facility-administered encounter medications on file as of 07/31/2018.     No Known Allergies  Review of Systems  Constitutional: Negative for activity change, appetite change, chills, fatigue, fever and unexpected weight change.  Eyes: Negative for photophobia and visual disturbance.  Respiratory: Negative for cough, chest tightness and shortness of breath.   Cardiovascular: Negative for chest pain, palpitations and leg swelling.  Gastrointestinal: Negative for abdominal distention, abdominal pain, anal bleeding, blood in stool, constipation, diarrhea, nausea, rectal pain and vomiting.  Endocrine: Negative for cold intolerance, heat intolerance, polydipsia, polyphagia and polyuria.  Genitourinary: Negative for decreased urine volume, difficulty urinating, vaginal bleeding, vaginal discharge and vaginal pain.  Musculoskeletal: Negative for arthralgias and myalgias.  Skin: Negative for color change, pallor, rash and wound.  Neurological: Negative for dizziness, tremors, seizures, syncope, facial asymmetry, speech difficulty, weakness, light-headedness, numbness and headaches.  Psychiatric/Behavioral: Positive for dysphoric mood and sleep disturbance. Negative for agitation, behavioral problems, confusion, decreased  concentration, hallucinations, self-injury and suicidal ideas. The patient is not nervous/anxious and is not hyperactive.   All other systems reviewed and are negative.       Objective:  BP 119/78   Pulse 68   Temp 98.2 F (36.8 C) (Oral)   Ht '5\' 3"'  (1.6 m)   Wt 168 lb (76.2 kg)   BMI 29.76 kg/m    Wt Readings from Last 3 Encounters:  07/31/18 168 lb (76.2 kg)  07/10/18 168 lb (76.2 kg)  03/12/18 154 lb (69.9 kg)    Physical Exam Vitals signs and nursing note reviewed.  Constitutional:      General: She is not in acute distress.    Appearance: Normal appearance. She is  well-developed and well-groomed. She is not ill-appearing, toxic-appearing or diaphoretic.  HENT:     Head: Normocephalic and atraumatic.     Jaw: There is normal jaw occlusion.     Right Ear: Hearing normal.     Left Ear: Hearing normal.     Nose: Nose normal.     Mouth/Throat:     Lips: Pink.     Mouth: Mucous membranes are moist.     Pharynx: Oropharynx is clear. Uvula midline.  Eyes:     General: Lids are normal.     Extraocular Movements: Extraocular movements intact.     Conjunctiva/sclera: Conjunctivae normal.     Pupils: Pupils are equal, round, and reactive to light.  Neck:     Musculoskeletal: Normal range of motion and neck supple.     Thyroid: No thyroid mass, thyromegaly or thyroid tenderness.     Vascular: No carotid bruit or JVD.     Trachea: Trachea and phonation normal.  Cardiovascular:     Rate and Rhythm: Normal rate and regular rhythm.     Chest Wall: PMI is not displaced.     Pulses: Normal pulses.     Heart sounds: Normal heart sounds. No murmur. No friction rub. No gallop.   Pulmonary:     Effort: Pulmonary effort is normal. No respiratory distress.     Breath sounds: Normal breath sounds. No wheezing.  Abdominal:     General: Abdomen is protuberant. Bowel sounds are normal. There is no distension or abdominal bruit.     Palpations: Abdomen is soft. There is no  hepatomegaly or splenomegaly.     Tenderness: There is no abdominal tenderness. There is no right CVA tenderness or left CVA tenderness.     Hernia: No hernia is present.  Musculoskeletal: Normal range of motion.     Right lower leg: No edema.     Left lower leg: No edema.  Lymphadenopathy:     Cervical: No cervical adenopathy.  Skin:    General: Skin is warm and dry.     Capillary Refill: Capillary refill takes less than 2 seconds.     Coloration: Skin is not cyanotic, jaundiced or pale.     Findings: No rash.  Neurological:     General: No focal deficit present.     Mental Status: She is alert and oriented to person, place, and time.     Cranial Nerves: Cranial nerves are intact.     Sensory: Sensation is intact.     Motor: Motor function is intact.     Coordination: Coordination is intact.     Gait: Gait is intact.     Deep Tendon Reflexes: Reflexes are normal and symmetric.  Psychiatric:        Attention and Perception: Attention and perception normal.        Mood and Affect: Mood and affect normal.        Speech: Speech normal.        Behavior: Behavior normal. Behavior is cooperative.        Thought Content: Thought content normal.        Cognition and Memory: Cognition and memory normal.        Judgment: Judgment normal.     Results for orders placed or performed in visit on 07/10/18  TSH  Result Value Ref Range   TSH 3.730 0.450 - 4.500 uIU/mL       Pertinent labs & imaging results that were available during my care of  the patient were reviewed by me and considered in my medical decision making.  Assessment & Plan:  Tea was seen today for annual exam.  Diagnoses and all orders for this visit:  Encounter for general adult medical examination with abnormal findings Health maintenance discussed. Will check to see when last Tdap was given. Pt states she has had it recently. Labs pending. Mammogram due in November.  -     CMP14+EGFR -     CBC with  Differential/Platelet -     Lipid panel -     TSH -     HIV Antibody (routine testing w rflx)  Overweight (BMI 25.0-29.9) Diet and exercise encouraged. Labs pending.  -     CMP14+EGFR -     Lipid panel -     TSH  Acquired hypothyroidism Labs pending. Will adjust medications if warranted.  -     TSH  Depression, recurrent ([place]) Continue therapy. Report any new or worsening symptoms.     Continue all other maintenance medications.  Follow up plan: Return in 6 months (on 01/30/2019), or if symptoms worsen or fail to improve, for Thyroid.  Educational handout given for health maintenance   The above assessment and management plan was discussed with the patient. The patient verbalized understanding of and has agreed to the management plan. Patient is aware to call the clinic if symptoms persist or worsen. Patient is aware when to return to the clinic for a follow-up visit. Patient educated on when it is appropriate to go to the emergency department.   Monia Pouch, FNP-C [facility] [phone]

## 2018-08-01 LAB — CBC WITH DIFFERENTIAL/PLATELET
Basophils Absolute: 0 10*3/uL (ref 0.0–0.2)
Basos: 1 %
EOS (ABSOLUTE): 0.2 10*3/uL (ref 0.0–0.4)
Eos: 4 %
Hematocrit: 38.2 % (ref 34.0–46.6)
Hemoglobin: 12.6 g/dL (ref 11.1–15.9)
Immature Grans (Abs): 0 10*3/uL (ref 0.0–0.1)
Immature Granulocytes: 0 %
Lymphocytes Absolute: 1.6 10*3/uL (ref 0.7–3.1)
Lymphs: 33 %
MCH: 31.1 pg (ref 26.6–33.0)
MCHC: 33 g/dL (ref 31.5–35.7)
MCV: 94 fL (ref 79–97)
Monocytes Absolute: 0.3 10*3/uL (ref 0.1–0.9)
Monocytes: 7 %
Neutrophils Absolute: 2.7 10*3/uL (ref 1.4–7.0)
Neutrophils: 55 %
Platelets: 224 10*3/uL (ref 150–450)
RBC: 4.05 x10E6/uL (ref 3.77–5.28)
RDW: 12.2 % (ref 11.7–15.4)
WBC: 4.9 10*3/uL (ref 3.4–10.8)

## 2018-08-01 LAB — LIPID PANEL
Chol/HDL Ratio: 2 ratio (ref 0.0–4.4)
Cholesterol, Total: 180 mg/dL (ref 100–199)
HDL: 89 mg/dL (ref >39)
LDL Calculated: 76 mg/dL (ref 0–99)
Triglycerides: 75 mg/dL (ref 0–149)
VLDL Cholesterol Cal: 15 mg/dL (ref 5–40)

## 2018-08-01 LAB — CMP14+EGFR
ALT: 15 IU/L (ref 0–32)
AST: 22 IU/L (ref 0–40)
Albumin/Globulin Ratio: 1.6 (ref 1.2–2.2)
Albumin: 4.4 g/dL (ref 3.8–4.8)
Alkaline Phosphatase: 67 IU/L (ref 39–117)
BUN/Creatinine Ratio: 14 (ref 9–23)
BUN: 11 mg/dL (ref 6–24)
Bilirubin Total: 0.3 mg/dL (ref 0.0–1.2)
CO2: 25 mmol/L (ref 20–29)
Calcium: 9.3 mg/dL (ref 8.7–10.2)
Chloride: 102 mmol/L (ref 96–106)
Creatinine, Ser: 0.76 mg/dL (ref 0.57–1.00)
GFR calc Af Amer: 106 mL/min/{1.73_m2} (ref >59)
GFR calc non Af Amer: 92 mL/min/{1.73_m2} (ref >59)
Globulin, Total: 2.7 g/dL (ref 1.5–4.5)
Glucose: 86 mg/dL (ref 65–99)
Potassium: 4.2 mmol/L (ref 3.5–5.2)
Sodium: 140 mmol/L (ref 134–144)
Total Protein: 7.1 g/dL (ref 6.0–8.5)

## 2018-08-01 LAB — HIV ANTIBODY (ROUTINE TESTING W REFLEX): HIV Screen 4th Generation wRfx: NONREACTIVE

## 2018-08-01 LAB — TSH: TSH: 2.81 u[IU]/mL (ref 0.450–4.500)

## 2019-07-07 ENCOUNTER — Other Ambulatory Visit: Payer: Self-pay

## 2019-07-07 MED ORDER — SERTRALINE HCL 100 MG PO TABS: 150.0000 mg | ORAL_TABLET | Freq: Every day | ORAL | 0 refills | Status: AC

## 2019-07-28 ENCOUNTER — Other Ambulatory Visit: Payer: Self-pay | Admitting: Family Medicine

## 2019-07-28 NOTE — Telephone Encounter (Signed)
Former Rakes. NTBS 30 days given 07/07/19 °

## 2019-07-29 ENCOUNTER — Other Ambulatory Visit: Payer: Self-pay | Admitting: *Deleted

## 2019-07-29 DIAGNOSIS — E039 Hypothyroidism, unspecified: Secondary | ICD-10-CM

## 2019-07-29 NOTE — Telephone Encounter (Signed)
Former Rakes NTBS LOV 07/31/18

## 2019-07-29 NOTE — Telephone Encounter (Signed)
Left detailed message will need to make an appointment

## 2019-07-29 NOTE — Telephone Encounter (Signed)
Left detailed message will need to make an appointment   

## 2020-03-07 ENCOUNTER — Encounter (HOSPITAL_COMMUNITY): Payer: Self-pay | Admitting: Emergency Medicine

## 2020-03-07 ENCOUNTER — Emergency Department (HOSPITAL_COMMUNITY)
Admission: EM | Admit: 2020-03-07 | Discharge: 2020-03-08 | Disposition: A | Discharge status: Home / Self Care | Payer: 59 | Attending: Emergency Medicine | Admitting: Emergency Medicine

## 2020-03-07 ENCOUNTER — Other Ambulatory Visit: Payer: Self-pay

## 2020-03-07 DIAGNOSIS — Z79899 Other long term (current) drug therapy: Secondary | ICD-10-CM | POA: Insufficient documentation

## 2020-03-07 DIAGNOSIS — E039 Hypothyroidism, unspecified: Secondary | ICD-10-CM | POA: Diagnosis not present

## 2020-03-07 DIAGNOSIS — R42 Dizziness and giddiness: Secondary | ICD-10-CM | POA: Diagnosis not present

## 2020-03-07 DIAGNOSIS — R110 Nausea: Secondary | ICD-10-CM | POA: Insufficient documentation

## 2020-03-07 DIAGNOSIS — Z7982 Long term (current) use of aspirin: Secondary | ICD-10-CM | POA: Insufficient documentation

## 2020-03-07 DIAGNOSIS — T50901A Poisoning by unspecified drugs, medicaments and biological substances, accidental (unintentional), initial encounter: Secondary | ICD-10-CM | POA: Diagnosis present

## 2020-03-07 DIAGNOSIS — R079 Chest pain, unspecified: Secondary | ICD-10-CM | POA: Insufficient documentation

## 2020-03-07 DIAGNOSIS — R531 Weakness: Secondary | ICD-10-CM | POA: Diagnosis not present

## 2020-03-07 MED ORDER — SODIUM CHLORIDE 0.9 % IV BOLUS
1000.0000 mL | Freq: Once | INTRAVENOUS | Status: AC
  Administered 2020-03-07: 1000 mL via INTRAVENOUS

## 2020-03-07 NOTE — ED Triage Notes (Signed)
Pt took one cbd gummy between 4-5pm today and since then c/o generalized weakness and dizziness at times.

## 2020-03-07 NOTE — ED Provider Notes (Signed)
[facility] EMERGENCY DEPARTMENT Provider Note   CSN: [id number] Arrival date & time: 03/07/20  1950     History Chief Complaint  Patient presents with  . Ingestion    Megan Hicks is a 55 y.o. female.  Patient is a 55 year old female with history of hypothyroidism and kidney stones.  She presents today for evaluation of medication reaction.  Patient tells me she was given an orange CBD gummy which she ate.  Shortly afterward while she was sleeping in the garage, she began to experience dizziness, weakness, and nausea.  This lasted for approximately 1 hour, then presented here for evaluation.  She denies shortness of breath, but does describe some tightness in her chest.  Patient denies alcohol or other substance use.  The history is provided by the patient.  Ingestion This is a new problem. The current episode started 3 to 5 hours ago. The problem occurs constantly. The problem has been gradually improving. Associated symptoms include chest pain. Pertinent negatives include no headaches and no shortness of breath. Nothing aggravates the symptoms. Nothing relieves the symptoms. She has tried nothing for the symptoms.       Past Medical History:  Diagnosis Date  . Anxiety   . Depression   . Headache    migraines  . History of kidney stones   . Hypothyroidism   . Thyroid disease     Patient Active Problem List   Diagnosis Date Noted  . Overweight (BMI 25.0-29.9) 12/03/2015  . Hypothyroidism 12/03/2015  . Elevated blood pressure reading 12/03/2015  . Depression 12/03/2015    Past Surgical History:  Procedure Laterality Date  . COLONOSCOPY N/A 01/29/2018   Procedure: COLONOSCOPY;  Surgeon: Corbin Ade, MD;  Location: [facility];  Service: Endoscopy;  Laterality: N/A;  2:45pm  . None       OB History   No obstetric history on file.     Family History  Problem Relation Age of Onset  . Arthritis Mother   . Hearing loss Mother   . Hypertension Mother    . Arthritis Father   . Diabetes Father   . Colon cancer Neg Hx   . Colon polyps Neg Hx     Social History   Tobacco Use  . Smoking status: Never Smoker  . Smokeless tobacco: Never Used  Vaping Use  . Vaping Use: Never used  Substance Use Topics  . Alcohol use: No  . Drug use: No    Home Medications Prior to Admission medications   Medication Sig Start Date End Date Taking? Authorizing Provider  Aspirin-Acetaminophen-Caffeine (EXCEDRIN PO) Take 1 tablet by mouth every 6 (six) hours as needed (headache).     [provider]  Brexpiprazole (REXULTI PO) Take by mouth.    [provider]  levothyroxine (SYNTHROID) 25 MCG tablet Take 1 tablet (25 mcg total) by mouth daily. Take 1 Tablet by mouth once daily before breakfast 07/10/18   Rakes, Doralee Albino, FNP  naproxen (NAPROSYN) 500 MG tablet naproxen 500 mg tablet    [provider]  promethazine (PHENERGAN) 25 MG tablet Take 1 tablet (25 mg total) by mouth every 8 (eight) hours as needed for nausea or vomiting. 08/29/17   Johna Sheriff, MD  propranolol (INDERAL) 10 MG tablet propranolol 10 mg tablet    [provider]  rizatriptan (MAXALT) 5 MG tablet Take 1 tablet (5 mg total) by mouth as needed for migraine. May repeat in 2 hours if needed 08/29/17  Johna Sheriff, MD  sertraline (ZOLOFT) 100 MG tablet Take 1.5 tablets (150 mg total) by mouth daily. Needs to be seen for further refills. 07/07/19   Mechele Claude, MD  traZODone (DESYREL) 50 MG tablet trazodone 50 mg tablet    [provider]  valACYclovir (VALTREX) 500 MG tablet Take 4 tabs twice a day for 1 day as needed Patient taking differently: Take 500 mg by mouth daily as needed (fever blisters). Take 4 tabs twice a day for 1 day as needed 09/05/17   Johna Sheriff, MD    Allergies    Patient has no known allergies.  Review of Systems   Review of Systems  Respiratory: Negative for shortness of breath.   Cardiovascular: Positive  for chest pain.  Neurological: Negative for headaches.  All other systems reviewed and are negative.   Physical Exam Updated Vital Signs BP 136/63   Pulse 69   Temp 98 F (36.7 C) (Oral)   Resp (!) 22   Ht 5\' 3"  (1.6 m)   Wt 83 kg   LMP 02/21/2020   SpO2 100%   BMI 32.42 kg/m   Physical Exam Vitals and nursing note reviewed.  Constitutional:      General: She is not in acute distress.    Appearance: She is well-developed and well-nourished. She is not diaphoretic.  HENT:     Head: Normocephalic and atraumatic.  Eyes:     Extraocular Movements: Extraocular movements intact.     Pupils: Pupils are equal, round, and reactive to light.  Cardiovascular:     Rate and Rhythm: Normal rate and regular rhythm.     Heart sounds: No murmur heard. No friction rub. No gallop.   Pulmonary:     Effort: Pulmonary effort is normal. No respiratory distress.     Breath sounds: Normal breath sounds. No wheezing.  Abdominal:     General: Bowel sounds are normal. There is no distension.     Palpations: Abdomen is soft.     Tenderness: There is no abdominal tenderness.  Musculoskeletal:        General: Normal range of motion.     Cervical back: Normal range of motion and neck supple.  Skin:    General: Skin is warm and dry.  Neurological:     General: No focal deficit present.     Mental Status: She is alert and oriented to person, place, and time.     Cranial Nerves: No cranial nerve deficit.     Sensory: No sensory deficit.     ED Results / Procedures / Treatments   Labs (all labs ordered are listed, but only abnormal results are displayed) Labs Reviewed  BASIC METABOLIC PANEL  CBC WITH DIFFERENTIAL/PLATELET  TROPONIN I (HIGH SENSITIVITY)    EKG EKG Interpretation  Date/Time:  Monday March 07 2020 23:59:19 EST Ventricular Rate:  83 PR Interval:    QRS Duration: 88 QT Interval:  412 QTC Calculation: 485 R Axis:   54 Text Interpretation: Sinus rhythm Normal ECG No  significant change since 04/05/2013 Confirmed by 04/07/2013 (Geoffery Lyons) on 03/08/2020 12:02:59 AM   Radiology No results found.  Procedures Procedures (including critical care time)  Medications Ordered in ED Medications  sodium chloride 0.9 % bolus 1,000 mL (has no administration in time range)    ED Course  I have reviewed the triage vital signs and the nursing notes.  Pertinent labs & imaging results that were available during my care of the patient  were reviewed by me and considered in my medical decision making (see chart for details).    MDM Rules/Calculators/A&P  Patient presenting after ingesting a CBD laced gummy treat.  She began to feel awkwardly within an hour of the ingestion.  She states she is feeling much better and her work-up is unremarkable.  At this time I feel as though discharge is appropriate.  She is to return as needed for any problems.  Final Clinical Impression(s) / ED Diagnoses Final diagnoses:  None    Rx / DC Orders ED Discharge Orders    None       Geoffery Lyons, MD 03/08/20 [phone]

## 2020-03-08 LAB — CBC WITH DIFFERENTIAL/PLATELET
Abs Immature Granulocytes: 0.02 10*3/uL (ref 0.00–0.07)
Basophils Absolute: 0 10*3/uL (ref 0.0–0.1)
Basophils Relative: 0 %
Eosinophils Absolute: 0 10*3/uL (ref 0.0–0.5)
Eosinophils Relative: 0 %
HCT: 44.8 % (ref 36.0–46.0)
Hemoglobin: 14.3 g/dL (ref 12.0–15.0)
Immature Granulocytes: 0 %
Lymphocytes Relative: 15 %
Lymphs Abs: 1.4 10*3/uL (ref 0.7–4.0)
MCH: 30.9 pg (ref 26.0–34.0)
MCHC: 31.9 g/dL (ref 30.0–36.0)
MCV: 96.8 fL (ref 80.0–100.0)
Monocytes Absolute: 0.6 10*3/uL (ref 0.1–1.0)
Monocytes Relative: 6 %
Neutro Abs: 7.6 10*3/uL (ref 1.7–7.7)
Neutrophils Relative %: 79 %
Platelets: 250 10*3/uL (ref 150–400)
RBC: 4.63 MIL/uL (ref 3.87–5.11)
RDW: 13 % (ref 11.5–15.5)
WBC: 9.6 10*3/uL (ref 4.0–10.5)
nRBC: 0 % (ref 0.0–0.2)

## 2020-03-08 LAB — BASIC METABOLIC PANEL
Anion gap: 10 (ref 5–15)
BUN: 25 mg/dL — ABNORMAL HIGH (ref 6–20)
CO2: 23 mmol/L (ref 22–32)
Calcium: 8.9 mg/dL (ref 8.9–10.3)
Chloride: 105 mmol/L (ref 98–111)
Creatinine, Ser: 0.89 mg/dL (ref 0.44–1.00)
GFR, Estimated: >60 mL/min (ref >60)
Glucose, Bld: 105 mg/dL — ABNORMAL HIGH (ref 70–99)
Potassium: 4.1 mmol/L (ref 3.5–5.1)
Sodium: 138 mmol/L (ref 135–145)

## 2020-03-08 LAB — TROPONIN I (HIGH SENSITIVITY): Troponin I (High Sensitivity): 16 ng/L (ref <18)

## 2020-03-08 NOTE — Discharge Instructions (Addendum)
Drink plenty of fluids for the next 24 hours.  Return to the emergency department if you develop any new and/or concerning symptoms.

## 2020-08-12 ENCOUNTER — Emergency Department (HOSPITAL_COMMUNITY): Payer: 59

## 2020-08-12 ENCOUNTER — Encounter (HOSPITAL_COMMUNITY): Payer: Self-pay | Admitting: *Deleted

## 2020-08-12 ENCOUNTER — Emergency Department (HOSPITAL_COMMUNITY)
Admission: EM | Admit: 2020-08-12 | Discharge: 2020-08-12 | Disposition: A | Discharge status: Home / Self Care | Payer: 59 | Attending: Emergency Medicine | Admitting: Emergency Medicine

## 2020-08-12 ENCOUNTER — Other Ambulatory Visit: Payer: Self-pay

## 2020-08-12 DIAGNOSIS — N390 Urinary tract infection, site not specified: Secondary | ICD-10-CM | POA: Insufficient documentation

## 2020-08-12 DIAGNOSIS — R112 Nausea with vomiting, unspecified: Secondary | ICD-10-CM | POA: Diagnosis not present

## 2020-08-12 DIAGNOSIS — E039 Hypothyroidism, unspecified: Secondary | ICD-10-CM | POA: Insufficient documentation

## 2020-08-12 DIAGNOSIS — R10819 Abdominal tenderness, unspecified site: Secondary | ICD-10-CM | POA: Insufficient documentation

## 2020-08-12 DIAGNOSIS — Z20822 Contact with and (suspected) exposure to covid-19: Secondary | ICD-10-CM | POA: Diagnosis not present

## 2020-08-12 DIAGNOSIS — M791 Myalgia, unspecified site: Secondary | ICD-10-CM | POA: Diagnosis not present

## 2020-08-12 DIAGNOSIS — R8290 Unspecified abnormal findings in urine: Secondary | ICD-10-CM | POA: Diagnosis present

## 2020-08-12 DIAGNOSIS — Z7982 Long term (current) use of aspirin: Secondary | ICD-10-CM | POA: Insufficient documentation

## 2020-08-12 LAB — URINALYSIS, ROUTINE W REFLEX MICROSCOPIC
Bacteria, UA: NONE SEEN
Glucose, UA: NEGATIVE mg/dL
Ketones, ur: 20 mg/dL — AB
Leukocytes,Ua: NEGATIVE
Nitrite: NEGATIVE
Protein, ur: 100 mg/dL — AB
Specific Gravity, Urine: 1.038 — ABNORMAL HIGH (ref 1.005–1.030)
pH: 5 (ref 5.0–8.0)

## 2020-08-12 LAB — COMPREHENSIVE METABOLIC PANEL
ALT: 29 U/L (ref 0–44)
AST: 35 U/L (ref 15–41)
Albumin: 4.2 g/dL (ref 3.5–5.0)
Alkaline Phosphatase: 79 U/L (ref 38–126)
Anion gap: 9 (ref 5–15)
BUN: 16 mg/dL (ref 6–20)
CO2: 27 mmol/L (ref 22–32)
Calcium: 8.8 mg/dL — ABNORMAL LOW (ref 8.9–10.3)
Chloride: 100 mmol/L (ref 98–111)
Creatinine, Ser: 0.84 mg/dL (ref 0.44–1.00)
GFR, Estimated: >60 mL/min (ref >60)
Glucose, Bld: 115 mg/dL — ABNORMAL HIGH (ref 70–99)
Potassium: 3.3 mmol/L — ABNORMAL LOW (ref 3.5–5.1)
Sodium: 136 mmol/L (ref 135–145)
Total Bilirubin: 0.6 mg/dL (ref 0.3–1.2)
Total Protein: 8.2 g/dL — ABNORMAL HIGH (ref 6.5–8.1)

## 2020-08-12 LAB — CBC WITH DIFFERENTIAL/PLATELET
Abs Immature Granulocytes: 0.02 10*3/uL (ref 0.00–0.07)
Basophils Absolute: 0 10*3/uL (ref 0.0–0.1)
Basophils Relative: 0 %
Eosinophils Absolute: 0 10*3/uL (ref 0.0–0.5)
Eosinophils Relative: 0 %
HCT: 45.9 % (ref 36.0–46.0)
Hemoglobin: 15 g/dL (ref 12.0–15.0)
Immature Granulocytes: 0 %
Lymphocytes Relative: 15 %
Lymphs Abs: 0.8 10*3/uL (ref 0.7–4.0)
MCH: 30.9 pg (ref 26.0–34.0)
MCHC: 32.7 g/dL (ref 30.0–36.0)
MCV: 94.4 fL (ref 80.0–100.0)
Monocytes Absolute: 0.4 10*3/uL (ref 0.1–1.0)
Monocytes Relative: 7 %
Neutro Abs: 4.2 10*3/uL (ref 1.7–7.7)
Neutrophils Relative %: 78 %
Platelets: 206 10*3/uL (ref 150–400)
RBC: 4.86 MIL/uL (ref 3.87–5.11)
RDW: 12.8 % (ref 11.5–15.5)
WBC: 5.5 10*3/uL (ref 4.0–10.5)
nRBC: 0 % (ref 0.0–0.2)

## 2020-08-12 LAB — LIPASE, BLOOD: Lipase: 28 U/L (ref 11–51)

## 2020-08-12 LAB — RESP PANEL BY RT-PCR (FLU A&B, COVID) ARPGX2
Influenza A by PCR: NEGATIVE
Influenza B by PCR: NEGATIVE
SARS Coronavirus 2 by RT PCR: NEGATIVE

## 2020-08-12 LAB — TROPONIN I (HIGH SENSITIVITY): Troponin I (High Sensitivity): <2 ng/L (ref <18)

## 2020-08-12 LAB — CK: Total CK: 44 U/L (ref 38–234)

## 2020-08-12 MED ORDER — ONDANSETRON HCL 4 MG/2ML IJ SOLN
4.0000 mg | Freq: Once | INTRAMUSCULAR | Status: AC
  Filled 2020-08-12: qty 2

## 2020-08-12 MED ORDER — LIDOCAINE VISCOUS HCL 2 % MT SOLN
15.0000 mL | Freq: Once | OROMUCOSAL | Status: AC
  Administered 2020-08-12: 15 mL via ORAL
  Filled 2020-08-12: qty 15

## 2020-08-12 MED ORDER — CEPHALEXIN 500 MG PO CAPS: 500.0000 mg | ORAL_CAPSULE | Freq: Four times a day (QID) | ORAL | 0 refills | Status: AC

## 2020-08-12 MED ORDER — ONDANSETRON HCL 4 MG PO TABS: 4.0000 mg | ORAL_TABLET | Freq: Four times a day (QID) | ORAL | 0 refills | Status: DC

## 2020-08-12 MED ORDER — ONDANSETRON HCL 4 MG/2ML IJ SOLN
INTRAMUSCULAR | Status: AC
  Administered 2020-08-12: 4 mg via INTRAVENOUS
  Filled 2020-08-12: qty 2

## 2020-08-12 MED ORDER — ALUM & MAG HYDROXIDE-SIMETH 200-200-20 MG/5ML PO SUSP
30.0000 mL | Freq: Once | ORAL | Status: AC
  Administered 2020-08-12: 30 mL via ORAL
  Filled 2020-08-12: qty 30

## 2020-08-12 MED ORDER — POTASSIUM CHLORIDE CRYS ER 20 MEQ PO TBCR
40.0000 meq | EXTENDED_RELEASE_TABLET | Freq: Once | ORAL | Status: AC
  Administered 2020-08-12: 40 meq via ORAL
  Filled 2020-08-12: qty 2

## 2020-08-12 MED ORDER — SODIUM CHLORIDE 0.9 % IV BOLUS
1000.0000 mL | Freq: Once | INTRAVENOUS | Status: AC
  Administered 2020-08-12: 1000 mL via INTRAVENOUS

## 2020-08-12 MED ORDER — IOHEXOL 300 MG/ML  SOLN
75.0000 mL | Freq: Once | INTRAMUSCULAR | Status: AC | PRN
  Administered 2020-08-12: 75 mL via INTRAVENOUS

## 2020-08-12 MED ORDER — SODIUM CHLORIDE 0.9 % IV SOLN
1.0000 g | Freq: Once | INTRAVENOUS | Status: AC
  Administered 2020-08-12: 1 g via INTRAVENOUS
  Filled 2020-08-12: qty 10

## 2020-08-12 NOTE — Discharge Instructions (Addendum)
You were given a prescription for antibiotics. Please take the antibiotic prescription fully.   Take zofran as prescribed  Please follow up with your primary doctor within the next 5-7 days.  If you do not have a primary care provider, information for a healthcare clinic has been provided for you to make arrangements for follow up care. Please return to the ER sooner if you have any new or worsening symptoms, or if you have any of the following symptoms:  Abdominal pain that does not go away.  You have a fever.  You keep throwing up (vomiting).  The pain is felt only in portions of the abdomen. Pain in the right side could possibly be appendicitis. In an adult, pain in the left lower portion of the abdomen could be colitis or diverticulitis.  You pass bloody or black tarry stools.  There is bright red blood in the stool.  The constipation stays for more than 4 days.  There is belly (abdominal) or rectal pain.  You do not seem to be getting better.  You have any questions or concerns.

## 2020-08-12 NOTE — ED Triage Notes (Signed)
States she was advised by her doctor to come in because her urine looked bad

## 2020-08-12 NOTE — ED Provider Notes (Signed)
[facility]ERGENCY DEPARTMENT Provider Note   CSN: [id number] Arrival date & time: 08/12/20  1432     History Chief Complaint  Patient presents with   Dysuria    Megan Hicks is a 55 y.o. female.  HPI   [age] y/o female with a h/o anxiety/depression, headache, nephrolithiasis, hypothyroidism, who presents to the emergency department today for evaluation of abnormal urine.  Patient was at her PCP office prior to arrival as she states she has not been feeling well for the last week.  She reports generalized malaise, generalized body aches, urinary frequency.  She further reports associated nausea and vomiting.  She had a negative COVID test at work and also had a negative COVID test at home.  She is had some congestion.  She denies any cough. She has some burning to her abdomen  Past Medical History:  Diagnosis Date   Anxiety    Depression    Headache    migraines   History of kidney stones    Hypothyroidism    Thyroid disease     Patient Active Problem List   Diagnosis Date Noted   Pain in joint of right shoulder 03/13/2018   Overweight (BMI 25.0-29.9) 12/03/2015   Hypothyroidism 12/03/2015   Elevated blood pressure reading 12/03/2015   Depression 12/03/2015    Past Surgical History:  Procedure Laterality Date   COLONOSCOPY N/A 01/29/2018   Procedure: COLONOSCOPY;  Surgeon: Corbin Adeourk, Robert M, MD;  Location: [facility];  Service: Endoscopy;  Laterality: N/A;  2:45pm   None       OB History   No obstetric history on file.     Family History  Problem Relation Age of Onset   Arthritis Mother    Hearing loss Mother    Hypertension Mother    Arthritis Father    Diabetes Father    Colon cancer Neg Hx    Colon polyps Neg Hx     Social History   Tobacco Use   Smoking status: Never   Smokeless tobacco: Never  Vaping Use   Vaping Use: Never used  Substance Use Topics   Alcohol use: No   Drug use: No    Home Medications Prior to Admission  medications   Medication Sig Start Date End Date Taking? Authorizing Provider  Aspirin-Acetaminophen-Caffeine (EXCEDRIN PO) Take 1 tablet by mouth every 6 (six) hours as needed (headache).    Yes [provider]  cephALEXin (KEFLEX) 500 MG capsule Take 1 capsule (500 mg total) by mouth 4 (four) times daily for 7 days. 08/12/20 08/19/20 Yes Rose Hegner S, PA-C  Cholecalciferol (VITAMIN D3 PO) Take 5,000 Units by mouth daily.   Yes [provider]  levothyroxine (SYNTHROID) 25 MCG tablet Take 1 tablet (25 mcg total) by mouth daily. Take 1 Tablet by mouth once daily before breakfast 07/10/18  Yes Rakes, Doralee AlbinoLinda M, FNP  ondansetron (ZOFRAN) 4 MG tablet Take 1 tablet (4 mg total) by mouth every 6 (six) hours. 08/12/20  Yes Averyana Pillars S, PA-C  sertraline (ZOLOFT) 100 MG tablet Take 1.5 tablets (150 mg total) by mouth daily. Needs to be seen for further refills. 07/07/19  Yes Stacks, Broadus JohnWarren, MD  Turmeric 500 MG CAPS Take 1 tablet by mouth daily.   Yes [provider]  valACYclovir (VALTREX) 500 MG tablet Take 4 tabs twice a day for 1 day as needed Patient taking differently: Take 500 mg by mouth daily as needed (fever blisters). Take 4 tabs twice a  day for 1 day as needed 09/05/17  Yes Johna Sheriff, MD  Brexpiprazole (REXULTI PO) Take by mouth. Patient not taking: No sig reported    [provider]  naproxen (NAPROSYN) 500 MG tablet naproxen 500 mg tablet Patient not taking: No sig reported    [provider]  promethazine (PHENERGAN) 25 MG tablet Take 1 tablet (25 mg total) by mouth every 8 (eight) hours as needed for nausea or vomiting. Patient not taking: Reported on 08/12/2020 08/29/17   Johna Sheriff, MD  propranolol (INDERAL) 10 MG tablet propranolol 10 mg tablet Patient not taking: No sig reported    [provider]  rizatriptan (MAXALT) 5 MG tablet Take 1 tablet (5 mg total) by mouth as needed for migraine. May repeat in 2 hours if  needed Patient not taking: Reported on 08/12/2020 08/29/17   Johna Sheriff, MD  traZODone (DESYREL) 50 MG tablet trazodone 50 mg tablet Patient not taking: No sig reported    [provider]    Allergies    Patient has no known allergies.  Review of Systems   Review of Systems  Constitutional:  Negative for chills and fever.  HENT:  Negative for ear pain and sore throat.   Eyes:  Negative for visual disturbance.  Respiratory:  Negative for cough and shortness of breath.   Cardiovascular:  Negative for chest pain.  Gastrointestinal:  Positive for nausea and vomiting. Negative for abdominal pain, constipation and diarrhea.  Genitourinary:  Positive for frequency and urgency. Negative for dysuria and hematuria.  Musculoskeletal:  Negative for back pain.  Skin:  Negative for rash.  Neurological:  Negative for headaches.  All other systems reviewed and are negative.  Physical Exam Updated Vital Signs BP 134/85   Pulse 65   Temp (!) 97.5 F (36.4 C) (Oral)   Resp (!) 22   SpO2 100%   Physical Exam Vitals and nursing note reviewed.  Constitutional:      General: She is not in acute distress.    Appearance: She is well-developed.  HENT:     Head: Normocephalic and atraumatic.     Mouth/Throat:     Mouth: Mucous membranes are dry.  Eyes:     Conjunctiva/sclera: Conjunctivae normal.  Cardiovascular:     Rate and Rhythm: Normal rate and regular rhythm.     Heart sounds: Normal heart sounds. No murmur heard. Pulmonary:     Effort: Pulmonary effort is normal. No respiratory distress.     Breath sounds: Normal breath sounds. No wheezing, rhonchi or rales.  Abdominal:     General: Bowel sounds are normal.     Palpations: Abdomen is soft.     Tenderness: There is abdominal tenderness (diffuse ttp). There is no guarding or rebound.  Musculoskeletal:     Cervical back: Neck supple.  Skin:    General: Skin is warm and dry.  Neurological:     Mental Status: She is  alert.    ED Results / Procedures / Treatments   Labs (all labs ordered are listed, but only abnormal results are displayed) Labs Reviewed  URINALYSIS, ROUTINE W REFLEX MICROSCOPIC - Abnormal; Notable for the following components:      Result Value   Color, Urine AMBER (*)    APPearance CLOUDY (*)    Specific Gravity, Urine 1.038 (*)    Hgb urine dipstick MODERATE (*)    Bilirubin Urine SMALL (*)    Ketones, ur 20 (*)    Protein,  ur 100 (*)    All other components within normal limits  COMPREHENSIVE METABOLIC PANEL - Abnormal; Notable for the following components:   Potassium 3.3 (*)    Glucose, Bld 115 (*)    Calcium 8.8 (*)    Total Protein 8.2 (*)    All other components within normal limits  RESP PANEL BY RT-PCR (FLU A&B, COVID) ARPGX2  CBC WITH DIFFERENTIAL/PLATELET  LIPASE, BLOOD  CK  TROPONIN I (HIGH SENSITIVITY)    EKG None  Radiology DG Chest 2 View  Result Date: 08/12/2020 CLINICAL DATA:  Nausea and vomiting.  Chest pain. EXAM: CHEST - 2 VIEW COMPARISON:  Chest x-ray dated April 12, 2013. FINDINGS: The heart size and mediastinal contours are within normal limits. Both lungs are clear. The visualized skeletal structures are unremarkable. IMPRESSION: No active cardiopulmonary disease. Electronically Signed   By: Obie Dredge M.D.   On: 08/12/2020 16:37   CT ABDOMEN PELVIS W CONTRAST  Result Date: 08/12/2020 CLINICAL DATA:  Back pain with nausea and vomiting. Dark urine. EXAM: CT ABDOMEN AND PELVIS WITH CONTRAST TECHNIQUE: Multidetector CT imaging of the abdomen and pelvis was performed using the standard protocol following bolus administration of intravenous contrast. CONTRAST:  25mL OMNIPAQUE IOHEXOL 300 MG/ML  SOLN COMPARISON:  CT abdomen pelvis dated December 03, 2013. FINDINGS: Lower chest: No acute abnormality. Hepatobiliary: No focal liver abnormality is seen. No gallstones, gallbladder wall thickening, or biliary dilatation. Pancreas: Unremarkable. No  pancreatic ductal dilatation or surrounding inflammatory changes. Spleen: Normal in size without focal abnormality. Adrenals/Urinary Tract: Adrenal glands are unremarkable. Unchanged small cyst in the upper pole of the right kidney. No renal calculi or hydronephrosis. The bladder is unremarkable. Stomach/Bowel: Stomach is within normal limits. Appendix appears normal. No evidence of bowel wall thickening, distention, or inflammatory changes. Vascular/Lymphatic: No significant vascular findings are present. Small reactive mesenteric lymph nodes. No pathologically enlarged abdominal or pelvic lymph nodes. Reproductive: Uterus and bilateral adnexa are unremarkable. Other: No abdominal wall hernia or abnormality. No abdominopelvic ascites. No pneumoperitoneum. Musculoskeletal: No acute or significant osseous findings. IMPRESSION: 1. No acute intra-abdominal process. Electronically Signed   By: Obie Dredge M.D.   On: 08/12/2020 21:11    Procedures Procedures   Medications Ordered in ED Medications  cefTRIAXone (ROCEPHIN) 1 g in sodium chloride 0.9 % 100 mL IVPB (has no administration in time range)  sodium chloride 0.9 % bolus 1,000 mL (0 mLs Intravenous Stopped 08/12/20 2054)  ondansetron (ZOFRAN) injection 4 mg (4 mg Intravenous Given 08/12/20 2019)  alum & mag hydroxide-simeth (MAALOX/MYLANTA) 200-200-20 MG/5ML suspension 30 mL (30 mLs Oral Given 08/12/20 1837)    And  lidocaine (XYLOCAINE) 2 % viscous mouth solution 15 mL (15 mLs Oral Given 08/12/20 1837)  potassium chloride SA (KLOR-CON) CR tablet 40 mEq (40 mEq Oral Given 08/12/20 1837)  iohexol (OMNIPAQUE) 300 MG/ML solution 75 mL (75 mLs Intravenous Contrast Given 08/12/20 2030)    ED Course  I have reviewed the triage vital signs and the nursing notes.  Pertinent labs & imaging results that were available during my care of the patient were reviewed by me and considered in my medical decision making (see chart for details).    MDM  Rules/Calculators/A&P                          55 y/o female who presents to the ED today for eval of abnormal urine. She is also c/o body aches, nv and generalized  malaise.   Reviewed/interpreted labs CBC unremarkable CMP with mild hypokalemia, otherwise unremarkable Lipase negative CK negative UA with hematuria, bilirubinuria and proteinuria. With the significant hematuria and urinary sxs noted, will give rx for keflex for home and culture the urine. COVID neg  Reviewed/interpreted labs CT abd/pelvis -  1. No acute intra-abdominal process  Pt tx with ceftriaxone here for concern for uti. She is tolerating po here in the ed and has had some improvement of her symptoms. Have advised that she follow closely with her pcp and RTER for new or worsening symptoms.  Final Clinical Impression(s) / ED Diagnoses Final diagnoses:  Lower urinary tract infectious disease    Rx / DC Orders ED Discharge Orders          Ordered    cephALEXin (KEFLEX) 500 MG capsule  4 times daily        08/12/20 2145    ondansetron (ZOFRAN) 4 MG tablet  Every 6 hours        08/12/20 2145             Rayne Du 08/12/20 2147    Terald Sleeper, MD 08/13/20 1121

## 2020-08-12 NOTE — ED Provider Notes (Signed)
Emergency Medicine Provider Triage Evaluation Note  Megan Hicks , a 55 y.o. female  was evaluated in triage.  Pt complains of back pain, andN/V.  Was given zofran by PCP PTA but was told urine looks bad.  Had a negative antigen test yesterday.  She is vaccinated with one booster.    She also reports chest pain.  She says her urine is very dark "like it was black."  Review of Systems  Positive: Back pain, myalgias Negative: fevers  Physical Exam  BP (!) 112/96   Pulse 83   Temp (!) 97.5 F (36.4 C) (Oral)   Resp 20   SpO2 100%  Gen:   Awake, no distress   Resp:  Normal effort  MSK:   Moves extremities without difficulty  Other:  Appears to feel unwell but awake and alert, speaks in full sentences.   Medical Decision Making  Medically screening exam initiated at 3:52 PM.  Appropriate orders placed.  Megan Hicks was informed that the remainder of the evaluation will be completed by another provider, this initial triage assessment does not replace that evaluation, and the importance of remaining in the ED until their evaluation is complete.     Cristina Gong, [place] 08/12/20 1556    Terald Sleeper, MD 08/13/20 [phone]

## 2021-04-16 ENCOUNTER — Other Ambulatory Visit: Payer: Self-pay

## 2021-04-16 ENCOUNTER — Emergency Department (HOSPITAL_COMMUNITY)
Admission: EM | Admit: 2021-04-16 | Discharge: 2021-04-17 | Discharge status: Against Medical Advice | Payer: 59 | Attending: Emergency Medicine | Admitting: Emergency Medicine

## 2021-04-16 DIAGNOSIS — Z5321 Procedure and treatment not carried out due to patient leaving prior to being seen by health care provider: Secondary | ICD-10-CM | POA: Diagnosis not present

## 2021-04-16 DIAGNOSIS — R531 Weakness: Secondary | ICD-10-CM | POA: Diagnosis not present

## 2021-04-16 NOTE — ED Triage Notes (Signed)
Pt reported to ED with c/o new onset weakness that started yesterday when she was cleaning her cat's litter box. Pt denies fainting, but states she almost fell over. States that she has not felt right since. Also expresses concerns that her right eye is weak, saying that "it took a lot of effort to get it open". Pt facial symmetry and movements appear normal at time of triage. NIHSS screening negative, pt able to ambulate with strong and steady gait and moves all extremities effortlessly and appropriately.

## 2021-04-17 NOTE — ED Notes (Signed)
Called pts name 3x to be roomed with no response.  

## 2021-04-17 NOTE — Progress Notes (Signed)
NEUROLOGY CONSULTATION NOTE  Megan Hicks MRN: [id number] DOB: 1965/05/21  Referring provider: Loletha Grayer, FNP-C Primary care provider: Sharlyne Cai, NP  Reason for consult:  dizziness  Assessment/Plan:   Posterior circulation CVA/TIA vs migraine.  Even with negative MRI, would treat for TIA - ASA 81mg  daily - PCP checked lipid panel yesterday.  I do not have results but my recommendation for the PCP is LDL goal less than 70 (start statin accordingly) - MRI brain and MRA head and neck - complete echocardiogram - Normotensive blood pressure - Hgb A1c goal less than 7 - Mediterranean diet - Further recommendations pending results. - Otherwise, follow up in 6 months.   Subjective:  Megan Hicks is a 56 year old right-handed female with hypothyroidism who presents for dizziness.  History supplemented by referring provider's note.  On 04/15/2021, she was walking in the house when she suddenly became dizzy, described as spinning sensation.  She also noted her right eyelid became weak and shut.  When she struggled to open her right eye, she saw wavy horizontal lines in her vision.  No double vision with both eyes closed.  Her husband noticed that the right lower part of her face looked slightly drooped.  No headache, nausea, slurred speech, language disturbance, ataxia or unilateral numbness or weakness.  Dizziness, lower facial weakness and visual disturbance lasted 5 minutes.  She went to the ED but lett before being seen due to long wait time.  Over the next day, her eyelid remained droopy until it gradually improved and resolved by the following night.  She saw her PCP yesterday who took labs, including CMP, TSH and lipid panel, and advised to start a baby aspirin.  She has past history of migraines with visual aura which present as vertical lines.  They are infrequent.  She had a CTA of head and neck on 06/13/2016 to evaluate headache which was personally reviewed and  was unremarkable.    PAST MEDICAL HISTORY: Past Medical History:  Diagnosis Date   Anxiety    Depression    Headache    migraines   History of kidney stones    Hypothyroidism    Thyroid disease     PAST SURGICAL HISTORY: Past Surgical History:  Procedure Laterality Date   COLONOSCOPY N/A 01/29/2018   Procedure: COLONOSCOPY;  Surgeon: Daneil Dolin, MD;  Location: [facility];  Service: Endoscopy;  Laterality: N/A;  2:45pm   None      MEDICATIONS: Current Outpatient Medications on File Prior to Visit  Medication Sig Dispense Refill   Aspirin-Acetaminophen-Caffeine (EXCEDRIN PO) Take 1 tablet by mouth every 6 (six) hours as needed (headache).      Brexpiprazole (REXULTI PO) Take by mouth. (Patient not taking: No sig reported)     Cholecalciferol (VITAMIN D3 PO) Take 5,000 Units by mouth daily.     levothyroxine (SYNTHROID) 25 MCG tablet Take 1 tablet (25 mcg total) by mouth daily. Take 1 Tablet by mouth once daily before breakfast 90 tablet 3   naproxen (NAPROSYN) 500 MG tablet naproxen 500 mg tablet (Patient not taking: No sig reported)     ondansetron (ZOFRAN) 4 MG tablet Take 1 tablet (4 mg total) by mouth every 6 (six) hours. 12 tablet 0   promethazine (PHENERGAN) 25 MG tablet Take 1 tablet (25 mg total) by mouth every 8 (eight) hours as needed for nausea or vomiting. (Patient not taking: Reported on 08/12/2020) 12 tablet 0   propranolol (INDERAL) 10 MG tablet  propranolol 10 mg tablet (Patient not taking: No sig reported)     rizatriptan (MAXALT) 5 MG tablet Take 1 tablet (5 mg total) by mouth as needed for migraine. May repeat in 2 hours if needed (Patient not taking: Reported on 08/12/2020) 10 tablet 1   sertraline (ZOLOFT) 100 MG tablet Take 1.5 tablets (150 mg total) by mouth daily. Needs to be seen for further refills. 30 tablet 0   traZODone (DESYREL) 50 MG tablet trazodone 50 mg tablet (Patient not taking: No sig reported)     Turmeric 500 MG CAPS Take 1 tablet by mouth  daily.     valACYclovir (VALTREX) 500 MG tablet Take 4 tabs twice a day for 1 day as needed (Patient taking differently: Take 500 mg by mouth daily as needed (fever blisters). Take 4 tabs twice a day for 1 day as needed) 10 tablet 1   No current facility-administered medications on file prior to visit.    ALLERGIES: No Known Allergies  FAMILY HISTORY: Family History  Problem Relation Age of Onset   Arthritis Mother    Hearing loss Mother    Hypertension Mother    Arthritis Father    Diabetes Father    Colon cancer Neg Hx    Colon polyps Neg Hx     Objective:  Blood pressure 122/80, pulse 76, height 5\' 3"  (1.6 m), weight 181 lb 9.6 oz (82.4 kg), SpO2 97 %. General: No acute distress.  Patient appears well-groomed.   Head:  Normocephalic/atraumatic Eyes:  fundi examined but not visualized Neck: supple, no paraspinal tenderness, full range of motion Back: No paraspinal tenderness Heart: regular rate and rhythm Lungs: Clear to auscultation bilaterally. Vascular: No carotid bruits. Neurological Exam: Mental status: alert and oriented to person, place, and time, recent and remote memory intact, fund of knowledge intact, attention and concentration intact, speech fluent and not dysarthric, language intact. Cranial nerves: CN I: not tested CN II: pupils equal, round and reactive to light, visual fields intact CN III, IV, VI:  full range of motion, no nystagmus, no ptosis CN V: facial sensation intact. CN VII: upper and lower face symmetric CN VIII: hearing intact CN IX, X: gag intact, uvula midline CN XI: sternocleidomastoid and trapezius muscles intact CN XII: tongue midline Bulk & Tone: normal, no fasciculations. Motor:  muscle strength 5/5 throughout Sensation:  Pinprick, temperature and vibratory sensation intact. Deep Tendon Reflexes:  2+ throughout,  toes downgoing.   Finger to nose testing:  Without dysmetria.   Heel to shin:  Without dysmetria.   Gait:  Normal station  and stride.  Romberg negative.    Thank you for allowing me to take part in the care of this patient.  Metta Clines, DO  CC:  Fanny Bien, FNP  Sharlyne Cai, NP

## 2021-04-17 NOTE — ED Notes (Signed)
Patient called for room x3 

## 2021-04-18 ENCOUNTER — Other Ambulatory Visit: Payer: Self-pay

## 2021-04-18 ENCOUNTER — Ambulatory Visit: Payer: 59 | Admitting: Neurology

## 2021-04-18 ENCOUNTER — Encounter: Payer: Self-pay | Admitting: Neurology

## 2021-04-18 VITALS — BP 122/80 | HR 76 | Ht 63.0 in | Wt 181.6 lb

## 2021-04-18 DIAGNOSIS — Z8669 Personal history of other diseases of the nervous system and sense organs: Secondary | ICD-10-CM

## 2021-04-18 DIAGNOSIS — I6350 Cerebral infarction due to unspecified occlusion or stenosis of unspecified cerebral artery: Secondary | ICD-10-CM | POA: Diagnosis not present

## 2021-04-18 NOTE — Patient Instructions (Signed)
Take aspirin 81mg  daily Check MRI of brain and MRA head and neck Check echocardiogram Further recommendations pending results.   Otherwise follow up 6 months.   Mediterranean Diet A Mediterranean diet refers to food and lifestyle choices that are based on the traditions of countries located on the . It focuses on eating more fruits, vegetables, whole grains, beans, nuts, seeds, and heart-healthy fats, and eating less dairy, meat, eggs, and processed foods with added sugar, salt, and fat. This way of eating has been shown to help prevent certain conditions and improve outcomes for people who have chronic diseases, like kidney disease and heart disease. What are tips for following this plan? Reading food labels Check the serving size of packaged foods. For foods such as rice and pasta, the serving size refers to the amount of cooked product, not dry. Check the total fat in packaged foods. Avoid foods that have saturated fat or trans fats. Check the ingredient list for added sugars, such as corn syrup. Shopping  Buy a variety of foods that offer a balanced diet, including: Fresh fruits and vegetables (produce). Grains, beans, nuts, and seeds. Some of these may be available in unpackaged forms or large amounts (in bulk). Fresh seafood. Poultry and eggs. Low-fat dairy products. Buy whole ingredients instead of prepackaged foods. Buy fresh fruits and vegetables in-season from local farmers markets. Buy plain frozen fruits and vegetables. If you do not have access to quality fresh seafood, buy precooked frozen shrimp or canned fish, such as tuna, salmon, or sardines. Stock your pantry so you always have certain foods on hand, such as olive oil, canned tuna, canned tomatoes, rice, pasta, and beans. Cooking Cook foods with extra-virgin olive oil instead of using butter or other vegetable oils. Have meat as a side dish, and have vegetables or grains as your main dish. This means  having meat in small portions or adding small amounts of meat to foods like pasta or stew. Use beans or vegetables instead of meat in common dishes like chili or lasagna. Experiment with different cooking methods. Try roasting, broiling, steaming, and sauting vegetables. Add frozen vegetables to soups, stews, pasta, or rice. Add nuts or seeds for added healthy fats and plant protein at each meal. You can add these to yogurt, salads, or vegetable dishes. Marinate fish or vegetables using olive oil, lemon juice, garlic, and fresh herbs. Meal planning Plan to eat one vegetarian meal one day each week. Try to work up to two vegetarian meals, if possible. Eat seafood two or more times a week. Have healthy snacks readily available, such as: Vegetable sticks with hummus. Greek yogurt. Fruit and nut trail mix. Eat balanced meals throughout the week. This includes: Fruit: 2-3 servings a day. Vegetables: 4-5 servings a day. Low-fat dairy: 2 servings a day. Fish, poultry, or lean meat: 1 serving a day. Beans and legumes: 2 or more servings a week. Nuts and seeds: 1-2 servings a day. Whole grains: 6-8 servings a day. Extra-virgin olive oil: 3-4 servings a day. Limit red meat and sweets to only a few servings a month. Lifestyle  Cook and eat meals together with your family, when possible. Drink enough fluid to keep your urine pale yellow. Be physically active every day. This includes: Aerobic exercise like running or swimming. Leisure activities like gardening, walking, or housework. Get 7-8 hours of sleep each night. If recommended by your health care provider, drink red wine in moderation. This means 1 glass a day for nonpregnant women and  2 glasses a day for men. A glass of wine equals 5 oz (150 mL). What foods should I eat? Fruits Apples. Apricots. Avocado. Berries. Bananas. Cherries. Dates. Figs. Grapes. Lemons. Melon. Oranges. Peaches. Plums. Pomegranate. Vegetables Artichokes. Beets.  Broccoli. Cabbage. Carrots. Eggplant. Green beans. Chard. Kale. Spinach. Onions. Leeks. Peas. Squash. Tomatoes. Peppers. Radishes. Grains Whole-grain pasta. Brown rice. Bulgur wheat. Polenta. Couscous. Whole-wheat bread. Orpah Cobb. Meats and other proteins Beans. Almonds. Sunflower seeds. Pine nuts. Peanuts. Cod. Salmon. Scallops. Shrimp. Tuna. Tilapia. Clams. Oysters. Eggs. Poultry without skin. Dairy Low-fat milk. Cheese. Greek yogurt. Fats and oils Extra-virgin olive oil. Avocado oil. Grapeseed oil. Beverages Water. Red wine. Herbal tea. Sweets and desserts Greek yogurt with honey. Baked apples. Poached pears. Trail mix. Seasonings and condiments Basil. Cilantro. Coriander. Cumin. Mint. Parsley. Sage. Rosemary. Tarragon. Garlic. Oregano. Thyme. Pepper. Balsamic vinegar. Tahini. Hummus. Tomato sauce. Olives. Mushrooms. The items listed above may not be a complete list of foods and beverages you can eat. Contact a dietitian for more information. What foods should I limit? This is a list of foods that should be eaten rarely or only on special occasions. Fruits Fruit canned in syrup. Vegetables Deep-fried potatoes (french fries). Grains Prepackaged pasta or rice dishes. Prepackaged cereal with added sugar. Prepackaged snacks with added sugar. Meats and other proteins Beef. Pork. Lamb. Poultry with skin. Hot dogs. Tomasa Blase. Dairy Ice cream. Sour cream. Whole milk. Fats and oils Butter. Canola oil. Vegetable oil. Beef fat (tallow). Lard. Beverages Juice. Sugar-sweetened soft drinks. Beer. Liquor and spirits. Sweets and desserts Cookies. Cakes. Pies. Candy. Seasonings and condiments Mayonnaise. Pre-made sauces and marinades. The items listed above may not be a complete list of foods and beverages you should limit. Contact a dietitian for more information. Summary The Mediterranean diet includes both food and lifestyle choices. Eat a variety of fresh fruits and vegetables, beans,  nuts, seeds, and whole grains. Limit the amount of red meat and sweets that you eat. If recommended by your health care provider, drink red wine in moderation. This means 1 glass a day for nonpregnant women and 2 glasses a day for men. A glass of wine equals 5 oz (150 mL). This information is not intended to replace advice given to you by your health care provider. Make sure you discuss any questions you have with your health care provider. Document Revised: 03/13/2019 Document Reviewed: 01/08/2019 Elsevier Patient Education  2022 [employer].

## 2021-05-07 ENCOUNTER — Ambulatory Visit
Admission: RE | Admit: 2021-05-07 | Discharge: 2021-05-07 | Disposition: A | Discharge status: Home / Self Care | Payer: 59 | Source: Ambulatory Visit | Attending: Neurology | Admitting: Neurology

## 2021-05-07 ENCOUNTER — Other Ambulatory Visit: Payer: Self-pay

## 2021-05-07 DIAGNOSIS — I6350 Cerebral infarction due to unspecified occlusion or stenosis of unspecified cerebral artery: Secondary | ICD-10-CM

## 2021-05-07 MED ORDER — GADOBENATE DIMEGLUMINE 529 MG/ML IV SOLN
17.0000 mL | Freq: Once | INTRAVENOUS | Status: AC | PRN
  Administered 2021-05-07: 17 mL via INTRAVENOUS

## 2021-05-17 NOTE — Progress Notes (Signed)
LMOVM to call office back

## 2021-05-18 ENCOUNTER — Telehealth: Payer: Self-pay | Admitting: Neurology

## 2021-05-18 NOTE — Telephone Encounter (Signed)
Patient called back

## 2021-05-18 NOTE — Telephone Encounter (Signed)
LMOVM please give the office a call back. Please see result note. ?

## 2021-05-18 NOTE — Telephone Encounter (Signed)
Pt's husband called in after receiving a missed call from [place]. I didn't see any notes, but thought it might be about MRI results ?

## 2021-05-19 NOTE — Telephone Encounter (Signed)
Please see result note 

## 2021-05-19 NOTE — Progress Notes (Signed)
Pt husband advised of Mri results.

## 2021-06-27 ENCOUNTER — Ambulatory Visit: Payer: 59 | Admitting: Podiatry

## 2021-06-27 ENCOUNTER — Ambulatory Visit (INDEPENDENT_AMBULATORY_CARE_PROVIDER_SITE_OTHER): Payer: 59

## 2021-06-27 DIAGNOSIS — M7752 Other enthesopathy of left foot: Secondary | ICD-10-CM

## 2021-06-27 DIAGNOSIS — M722 Plantar fascial fibromatosis: Secondary | ICD-10-CM

## 2021-06-27 DIAGNOSIS — M7750 Other enthesopathy of unspecified foot: Secondary | ICD-10-CM

## 2021-06-27 MED ORDER — MELOXICAM 15 MG PO TABS: 15.0000 mg | ORAL_TABLET | Freq: Every day | ORAL | 3 refills | Status: DC

## 2021-06-27 NOTE — Patient Instructions (Signed)

## 2021-06-27 NOTE — Progress Notes (Signed)
?  Subjective:  ?Patient ID: Megan Hicks, female    DOB: [birth year],  MRN: [id number] ? ?Chief Complaint  ?Patient presents with  ? Foot Pain  ?  Patient complaining of left heel pain. Started about a month ago. Patient was seen by podiatrist years ago and was told she had a bone spur. Sharp and throbbing pain after standing for a period of time.   ? ? ?56 y.o. female presents with the above complaint. History confirmed with patient.  ? ?Objective:  ?Physical Exam: ?warm, good capillary refill, no trophic changes or ulcerative lesions, normal DP and PT pulses, and normal sensory exam. ?Left Foot: point tenderness over the heel pad ? ? ?Radiographs: ?Multiple views x-ray of the left foot: no fracture, dislocation, swelling or degenerative changes noted and plantar calcaneal spur ?Assessment:  ? ?1. Plantar fasciitis, left   ? ? ? ?Plan:  ?Patient was evaluated and treated and all questions answered. ? ?Discussed the etiology and treatment options for plantar fasciitis including stretching, formal physical therapy, supportive shoegears such as a running shoe or sneaker, pre fabricated orthoses, injection therapy, and oral medications. We also discussed the role of surgical treatment of this for patients who do not improve after exhausting non-surgical treatment options. ? ? ?-XR reviewed with patient ?-Educated patient on stretching and icing of the affected limb ?-Plantar fascial brace dispensed ?-Injection delivered to the plantar fascia of the left foot. ?-Rx for meloxicam. Educated on use, risks and benefits of the medication ? ?After sterile prep with povidone-iodine solution and alcohol, the left heel was injected with 0.5cc 2% xylocaine plain, 0.5cc 0.5% marcaine plain, 5mg  triamcinolone acetonide, and 2mg  dexamethasone was injected along the medial plantar fascia at the insertion on the plantar calcaneus. The patient tolerated the procedure well without complication. ? ?Return in about 1 month (around  07/28/2021) for recheck plantar fasciitis.  ? ?

## 2021-08-01 ENCOUNTER — Ambulatory Visit: Payer: 59 | Admitting: Podiatry

## 2021-08-16 ENCOUNTER — Ambulatory Visit: Payer: 59 | Admitting: Podiatry

## 2021-08-16 DIAGNOSIS — M722 Plantar fascial fibromatosis: Secondary | ICD-10-CM

## 2021-08-19 NOTE — Progress Notes (Signed)
  Subjective:  Patient ID: Megan Hicks, female    DOB: 05-Apr-1965,  MRN: [id number]  Chief Complaint  Patient presents with   Plantar Fasciitis    Left foot pain     56 y.o. female presents with the above complaint. History confirmed with patient.  Much better probably 60 to 70% better.  Objective:  Physical Exam: warm, good capillary refill, no trophic changes or ulcerative lesions, normal DP and PT pulses, and normal sensory exam. Left Foot: point tenderness over the heel pad   Radiographs: Multiple views x-ray of the left foot: no fracture, dislocation, swelling or degenerative changes noted and plantar calcaneal spur Assessment:   1. Plantar fasciitis, left      Plan:  Patient was evaluated and treated and all questions answered.  Discussed the etiology and treatment options for plantar fasciitis including stretching, formal physical therapy, supportive shoegears such as a running shoe or sneaker, pre fabricated orthoses, injection therapy, and oral medications. We also discussed the role of surgical treatment of this for patients who do not improve after exhausting non-surgical treatment options.   Nearly fully resolved would like to have limited completely.  I recommended repeat injection today.  This eliminate the issue for her.  After sterile prep with povidone-iodine solution and alcohol, the left heel was injected with 0.5cc 2% xylocaine plain, 0.5cc 0.5% marcaine plain, 5mg  triamcinolone acetonide, and 2mg  dexamethasone was injected along the medial plantar fascia at the insertion on the plantar calcaneus. The patient tolerated the procedure well without complication.  Return if symptoms worsen or fail to improve.

## 2021-10-02 ENCOUNTER — Ambulatory Visit: Payer: 59 | Attending: Sports Medicine

## 2021-10-02 ENCOUNTER — Other Ambulatory Visit: Payer: Self-pay

## 2021-10-02 DIAGNOSIS — M25552 Pain in left hip: Secondary | ICD-10-CM | POA: Insufficient documentation

## 2021-10-02 DIAGNOSIS — M25652 Stiffness of left hip, not elsewhere classified: Secondary | ICD-10-CM | POA: Insufficient documentation

## 2021-10-02 NOTE — Progress Notes (Unsigned)
NEUROLOGY FOLLOW UP OFFICE NOTE  SHYASIA FUNCHES [id number]  Assessment/Plan:   Posterior circulation CVA/TIA vs migraine.  Even with negative MRI, would treat for TIA - ASA 81mg  daily - PCP checked lipid panel yesterday.  I do not have results but my recommendation for the PCP is LDL goal less than 70 (start statin accordingly) - MRI brain and MRA head and neck - complete echocardiogram - Normotensive blood pressure - Hgb A1c goal less than 7 - Mediterranean diet - Further recommendations pending results. - Otherwise, follow up in 6 months.     Subjective:  Megan Hicks is a 56 year old right-handed female with hypothyroidism who follows up for dizziness.  UPDATE: Underwent stroke workup: MRI brain and MRA head and neck performed on 05/07/2021 revealed normal brain but did show moderate stenosis of the left vertebral artery at the V1/V2 junction.     HISTORY: On 04/15/2021, she was walking in the house when she suddenly became dizzy, described as spinning sensation.  She also noted her right eyelid became weak and shut.  When she struggled to open her right eye, she saw wavy horizontal lines in her vision.  No double vision with both eyes closed.  Her husband noticed that the right lower part of her face looked slightly drooped.  No headache, nausea, slurred speech, language disturbance, ataxia or unilateral numbness or weakness.  Dizziness, lower facial weakness and visual disturbance lasted 5 minutes.  She went to the ED but lett before being seen due to long wait time.  Over the next day, her eyelid remained droopy until it gradually improved and resolved by the following night.  She saw her PCP yesterday who took labs, including CMP, TSH and lipid panel, and advised to start a baby aspirin.   She has past history of migraines with visual aura which present as vertical lines.  They are infrequent.  She had a CTA of head and neck on 06/13/2016 to evaluate headache which was  personally reviewed and was unremarkable.    PAST MEDICAL HISTORY: Past Medical History:  Diagnosis Date   Anxiety    Depression    Headache    migraines   History of kidney stones    Hypothyroidism    Thyroid disease     MEDICATIONS: Current Outpatient Medications on File Prior to Visit  Medication Sig Dispense Refill   Aspirin-Acetaminophen-Caffeine (EXCEDRIN PO) Take 1 tablet by mouth every 6 (six) hours as needed (headache).      Brexpiprazole (REXULTI PO) Take by mouth. (Patient not taking: No sig reported)     Cholecalciferol (VITAMIN D3 PO) Take 5,000 Units by mouth daily.     levothyroxine (SYNTHROID) 25 MCG tablet Take 1 tablet (25 mcg total) by mouth daily. Take 1 Tablet by mouth once daily before breakfast 90 tablet 3   meloxicam (MOBIC) 15 MG tablet Take 1 tablet (15 mg total) by mouth daily. 30 tablet 3   ondansetron (ZOFRAN) 4 MG tablet Take 1 tablet (4 mg total) by mouth every 6 (six) hours. 12 tablet 0   promethazine (PHENERGAN) 25 MG tablet Take 1 tablet (25 mg total) by mouth every 8 (eight) hours as needed for nausea or vomiting. (Patient not taking: Reported on 08/12/2020) 12 tablet 0   propranolol (INDERAL) 10 MG tablet propranolol 10 mg tablet (Patient not taking: No sig reported)     rizatriptan (MAXALT) 5 MG tablet Take 1 tablet (5 mg total) by mouth as needed for migraine. May  repeat in 2 hours if needed (Patient not taking: Reported on 08/12/2020) 10 tablet 1   rosuvastatin (CRESTOR) 40 MG tablet Take 40 mg by mouth daily.     sertraline (ZOLOFT) 100 MG tablet Take 1.5 tablets (150 mg total) by mouth daily. Needs to be seen for further refills. 30 tablet 0   traZODone (DESYREL) 50 MG tablet trazodone 50 mg tablet (Patient not taking: No sig reported)     Turmeric 500 MG CAPS Take 1 tablet by mouth daily.     valACYclovir (VALTREX) 500 MG tablet Take 4 tabs twice a day for 1 day as needed (Patient taking differently: Take 500 mg by mouth daily as needed (fever  blisters). Take 4 tabs twice a day for 1 day as needed) 10 tablet 1   No current facility-administered medications on file prior to visit.    ALLERGIES: No Known Allergies  FAMILY HISTORY: Family History  Problem Relation Age of Onset   Arthritis Mother    Hearing loss Mother    Hypertension Mother    Arthritis Father    Diabetes Father    Colon cancer Neg Hx    Colon polyps Neg Hx       Objective:  *** General: No acute distress.  Patient appears ***-groomed.   Head:  Normocephalic/atraumatic Eyes:  Fundi examined but not visualized Neck: supple, no paraspinal tenderness, full range of motion Heart:  Regular rate and rhythm Lungs:  Clear to auscultation bilaterally Back: No paraspinal tenderness Neurological Exam: alert and oriented to person, place, and time.  Speech fluent and not dysarthric, language intact.  CN II-XII intact. Bulk and tone normal, muscle strength 5/5 throughout.  Sensation to light touch intact.  Deep tendon reflexes 2+ throughout, toes downgoing.  Finger to nose testing intact.  Gait normal, Romberg negative.   Shon Millet, DO  CC: ***

## 2021-10-02 NOTE — Therapy (Signed)
OUTPATIENT PHYSICAL THERAPY LOWER EXTREMITY EVALUATION   Patient Name: Megan Hicks MRN: [id number] DOB:1965-07-23, 56 y.o., female Today's Date: 10/02/2021   PT End of Session - 10/02/21 1123     Visit Number 1    Number of Visits 8    Date for PT Re-Evaluation 11/03/21    PT Start Time 1124    PT Stop Time 1200    PT Time Calculation (min) 36 min    Activity Tolerance Patient tolerated treatment well    Behavior During Therapy [facility] for tasks assessed/performed             Past Medical History:  Diagnosis Date   Anxiety    Depression    Headache    migraines   History of kidney stones    Hypothyroidism    Thyroid disease    Past Surgical History:  Procedure Laterality Date   COLONOSCOPY N/A 01/29/2018   Procedure: COLONOSCOPY;  Surgeon: Corbin Ade, MD;  Location: [facility];  Service: Endoscopy;  Laterality: N/A;  2:45pm   None     Patient Active Problem List   Diagnosis Date Noted   Pain in joint of right shoulder 03/13/2018   Overweight (BMI 25.0-29.9) 12/03/2015   Hypothyroidism 12/03/2015   Elevated blood pressure reading 12/03/2015   Depression 12/03/2015   REFERRING PROVIDER: Delfin Gant, MD  REFERRING DIAG: Pain in left hip  THERAPY DIAG:  No diagnosis found.  Rationale for Evaluation and Treatment Rehabilitation  ONSET DATE: about 1 year ago  SUBJECTIVE:   SUBJECTIVE STATEMENT: Patient reports that her left hip started about 1 year and it has been slowly getting worse since it first began. She is unable to recall a mechanism of injury to her left hip.   PERTINENT HISTORY: None reported  PAIN:  Are you having pain? Yes: NPRS scale: 6/10 Pain location: left lateral and posterior hip Pain description: aching, sore, and intermittent stabbing Aggravating factors: squatting, bending Relieving factors: rest  PRECAUTIONS: Fall  WEIGHT BEARING RESTRICTIONS No  FALLS:  Has patient fallen in last 6 months? Yes. Number of  falls 1, she was picking up sticks in the yard and got caught in a vine about 3 days ago. She landed on her left hip and knee.  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  NEXT MD FOLLOW UP: 10/13/21  PLOF: Independent  PATIENT GOALS reduced pain, navigate stairs easier, squat, and lift    OBJECTIVE:   PATIENT SURVEYS:  FOTO to be completed at next appointment, as able  COGNITION:  Overall cognitive status: Within functional limits for tasks assessed     SENSATION: No numbness or tingling reported  POSTURE: rounded shoulders, forward head, and weight shift right  PALPATION: TTP: left lower lumbar paraspinals, gluteals, and IT band Reproduced familiar pain: left piriformis and TFL   LOWER EXTREMITY ROM:  Active ROM Right eval Left eval  Hip flexion 95 97  Hip extension    Hip abduction    Hip adduction 34 15 with familiar hip pain  Hip internal rotation    Hip external rotation    Knee flexion    Knee extension    Ankle dorsiflexion  Ankle plantarflexion    Ankle inversion    Ankle eversion     (Blank rows = not tested)  LOWER EXTREMITY MMT:  MMT Right eval Left eval  Hip flexion 4/5 4/5  Hip extension    Hip abduction    Hip adduction    Hip internal rotation    Hip external rotation    Knee flexion 4+/5 4/5  Knee extension 4/5 4/5  Ankle dorsiflexion    Ankle plantarflexion    Ankle inversion    Ankle eversion     (Blank rows = not tested)  LOWER EXTREMITY SPECIAL TESTS:  Hip special tests: Long axis distraction (LLE): relieved hip pain  GAIT: Assistive device utilized: None Level of assistance: Complete  Independence Comments: WFL     TODAY'S TREATMENT:                                   8/14 EXERCISE LOG  Exercise Repetitions and Resistance Comments  Supine hip IR/ ER  15 reps each   Bent knee fall out 15 reps each                Blank cell = exercise not performed today  Manual Therapy Manual Traction: left hip, long axis distraction for pain relief    PATIENT EDUCATION:  Education details: POC, HEP, healing Person educated: Patient Education method: Explanation Education comprehension: verbalized understanding   HOME EXERCISE PROGRAM: [id number]  ASSESSMENT:  CLINICAL IMPRESSION: Patient is a 56 y.o. female who was seen today for physical therapy evaluation and treatment for chronic left hip pain. She presented with moderate pain severity and irritability with left hip abduction and palpation to her left hip abductors and piriformis. She was provided a HEP which was able to slightly reduce her familiar symptoms. She reported feeling comfortable with these interventions. Recommend that she continue with skilled physical therapy to address her remaining impairments to return to her prior level of function.    OBJECTIVE IMPAIRMENTS decreased activity tolerance, decreased balance, decreased mobility, difficulty walking, decreased ROM, decreased strength, postural dysfunction, and pain.   ACTIVITY LIMITATIONS lifting, bending, squatting, sleeping, stairs, and locomotion level  PARTICIPATION LIMITATIONS: community activity, occupation, and yard work  PERSONAL FACTORS Time since onset of injury/illness/exacerbation are also affecting patient's functional outcome.   REHAB POTENTIAL: Good  CLINICAL DECISION MAKING: Evolving/moderate complexity  EVALUATION COMPLEXITY: Moderate   GOALS: Goals reviewed with patient? No  LONG TERM GOALS: Target date: 10/30/2021   Patient will be independent with her HEP.  Baseline:  Goal status: INITIAL  2.  Patient will be able to  complete her daily activities without her familiar pain exceeding 4/10. Baseline:  Goal status: INITIAL  3.  Patient will be able to navigate at least 4 steps with a reciprocal pattern without being limited by her familiar left hip pain.  Baseline:  Goal status: INITIAL  4.  Patient will be able to lift at least 15 pounds without being limited by her familiar left hip pain.  Baseline:  Goal status: INITIAL   PLAN: PT FREQUENCY: 2x/week  PT DURATION: 4 weeks  PLANNED INTERVENTIONS: Therapeutic exercises, Therapeutic activity, Neuromuscular re-education, Balance training, Patient/Family education, Self Care, Joint mobilization, Stair training, Dry Needling, Electrical stimulation, Cryotherapy, Moist heat, Taping, Manual therapy, and Re-evaluation  PLAN FOR NEXT SESSION: nustep, long axis distraction, bridges, side stepping, and modalities as needed   Granville Lewis, PT 10/02/2021, 12:42  PM  

## 2021-10-03 ENCOUNTER — Encounter: Payer: Self-pay | Admitting: Neurology

## 2021-10-03 ENCOUNTER — Ambulatory Visit: Payer: 59 | Admitting: Neurology

## 2021-10-03 VITALS — BP 119/78 | HR 70 | Ht 63.0 in | Wt 186.0 lb

## 2021-10-03 DIAGNOSIS — G43009 Migraine without aura, not intractable, without status migrainosus: Secondary | ICD-10-CM | POA: Diagnosis not present

## 2021-10-03 DIAGNOSIS — G43809 Other migraine, not intractable, without status migrainosus: Secondary | ICD-10-CM | POA: Diagnosis not present

## 2021-10-03 DIAGNOSIS — I6350 Cerebral infarction due to unspecified occlusion or stenosis of unspecified cerebral artery: Secondary | ICD-10-CM

## 2021-10-03 MED ORDER — TOPIRAMATE 25 MG PO TABS: 25.0000 mg | ORAL_TABLET | Freq: Every day | ORAL | 5 refills | Status: DC

## 2021-10-03 NOTE — Patient Instructions (Signed)
For headache prevention, take topiramate 25mg  at bedtime.  We can increase dose in 8 weeks if needed Continue aspirin 81mg  daily and rosuvastatin Limit use of pain relievers to no more than 2 days out of week to prevent risk of rebound or medication-overuse headache. Keep headache diary 2D echocardiogram Follow up 6 months.

## 2021-10-05 ENCOUNTER — Ambulatory Visit: Payer: 59 | Admitting: Physical Therapy

## 2021-10-05 ENCOUNTER — Encounter: Payer: Self-pay | Admitting: Physical Therapy

## 2021-10-05 DIAGNOSIS — M25652 Stiffness of left hip, not elsewhere classified: Secondary | ICD-10-CM

## 2021-10-05 DIAGNOSIS — M25552 Pain in left hip: Secondary | ICD-10-CM

## 2021-10-05 NOTE — Therapy (Signed)
OUTPATIENT PHYSICAL THERAPY LOWER EXTREMITY EVALUATION   Patient Name: MAHNOOR MATHISEN MRN: [id number] DOB:April 07, 1965, 56 y.o., female Today's Date: 10/05/2021   PT End of Session - 10/05/21 0958     Visit Number 2    Number of Visits 8    Date for PT Re-Evaluation 11/03/21    PT Start Time 0948    PT Stop Time 1030    PT Time Calculation (min) 42 min    Activity Tolerance Patient tolerated treatment well    Behavior During Therapy [facility] for tasks assessed/performed             Past Medical History:  Diagnosis Date   Anxiety    Depression    Headache    migraines   History of kidney stones    Hypothyroidism    Thyroid disease    Past Surgical History:  Procedure Laterality Date   COLONOSCOPY N/A 01/29/2018   Procedure: COLONOSCOPY;  Surgeon: Corbin Ade, MD;  [facility];  Service: Endoscopy;  Laterality: N/A;  2:45pm   None     Patient Active Problem List   Diagnosis Date Noted   Pain in joint of right shoulder 03/13/2018   Overweight (BMI 25.0-29.9) 12/03/2015   Hypothyroidism 12/03/2015   Elevated blood pressure reading 12/03/2015   Depression 12/03/2015   REFERRING PROVIDER: Delfin Gant, MD  REFERRING DIAG: Pain in left hip  THERAPY DIAG:  Pain in left hip  Stiffness of left hip, not elsewhere classified  Rationale for Evaluation and Treatment Rehabilitation  ONSET DATE: about 1 year ago  SUBJECTIVE:   SUBJECTIVE STATEMENT: Not having much pain upon arrival.   PERTINENT HISTORY: None reported  PAIN:  No pain on arrival.  PRECAUTIONS: Fall  WEIGHT BEARING RESTRICTIONS No   OBJECTIVE:   PATIENT SURVEYS:  FOTO 41% limitation  TODAY'S TREATMENT:                                   8/17 EXERCISE LOG  Exercise Repetitions and Resistance Comments  Nustep L3 X11 min   SKTC 3x15 sec   L figure 4 stretch 3x30 sec   Bridge X10 reps (3/10 pain)   SL LLE clam 2x10 reps   Standing L hip abduction X20 reps   Standing L  hip extension X20 reps    Blank cell = exercise not performed today   Modalities  Date:  Unattended Estim: Hip, Pre-Mod, 10 mins, Pain Hot Pack: Hip, 10 mins, Pain  PATIENT EDUCATION:  Education details: POC, HEP, healing Person educated: Patient Education method: Explanation Education comprehension: verbalized understanding   HOME EXERCISE PROGRAM: [id number]  ASSESSMENT:  CLINICAL IMPRESSION: Patient presented in clinic with reports of no hip pain upon arrival. Patient able to tolerate all therex fairly well with slight increase to L hip pain intermittantly especially with abduction or ER. Patient educated with self massage if she could tolerate. Normal modalities response noted following removal of the modalities.   OBJECTIVE IMPAIRMENTS decreased activity tolerance, decreased balance, decreased mobility, difficulty walking, decreased ROM, decreased strength, postural dysfunction, and pain.   ACTIVITY LIMITATIONS lifting, bending, squatting, sleeping, stairs, and locomotion level  PARTICIPATION LIMITATIONS: community activity, occupation, and yard work  PERSONAL FACTORS Time since onset of injury/illness/exacerbation are also affecting patient's functional outcome.   REHAB POTENTIAL: Good  CLINICAL DECISION MAKING: Evolving/moderate complexity  EVALUATION COMPLEXITY: Moderate   GOALS: Goals reviewed with patient? No  LONG TERM GOALS: Target date: 10/30/2021   Patient will be independent with her HEP.  Baseline:  Goal status: INITIAL  2.  Patient will be able to complete her daily activities without her familiar pain exceeding 4/10. Baseline:  Goal status: INITIAL  3.  Patient will be able to navigate at least 4 steps with a reciprocal pattern without being limited by her familiar left hip pain.  Baseline:  Goal status: INITIAL  4.  Patient will be able to lift at least 15 pounds without being limited by her familiar left hip pain.  Baseline:  Goal status:  INITIAL   PLAN: PT FREQUENCY: 2x/week  PT DURATION: 4 weeks  PLANNED INTERVENTIONS: Therapeutic exercises, Therapeutic activity, Neuromuscular re-education, Balance training, Patient/Family education, Self Care, Joint mobilization, Stair training, Dry Needling, Electrical stimulation, Cryotherapy, Moist heat, Taping, Manual therapy, and Re-evaluation  PLAN FOR NEXT SESSION: nustep, long axis distraction, bridges, side stepping, and modalities as needed   Marvell Fuller, PTA 10/05/2021, 10:41 AM

## 2021-10-10 ENCOUNTER — Ambulatory Visit: Payer: 59 | Admitting: *Deleted

## 2021-10-10 ENCOUNTER — Encounter: Payer: Self-pay | Admitting: *Deleted

## 2021-10-10 DIAGNOSIS — M25652 Stiffness of left hip, not elsewhere classified: Secondary | ICD-10-CM

## 2021-10-10 DIAGNOSIS — M25552 Pain in left hip: Secondary | ICD-10-CM

## 2021-10-10 NOTE — Therapy (Signed)
OUTPATIENT PHYSICAL THERAPY LOWER EXTREMITY EVALUATION   Patient Name: Megan Hicks MRN: [id number] DOB:03-18-1965, 56 y.o., female Today's Date: 10/10/2021   PT End of Session - 10/10/21 1036     Visit Number 3    Number of Visits 8    Date for PT Re-Evaluation 11/03/21    PT Start Time 1030    PT Stop Time 1120    PT Time Calculation (min) 50 min             Past Medical History:  Diagnosis Date   Anxiety    Depression    Headache    migraines   History of kidney stones    Hypothyroidism    Thyroid disease    Past Surgical History:  Procedure Laterality Date   COLONOSCOPY N/A 01/29/2018   Procedure: COLONOSCOPY;  Surgeon: Corbin Ade, MD;  [facility];  Service: Endoscopy;  Laterality: N/A;  2:45pm   None     Patient Active Problem List   Diagnosis Date Noted   Pain in joint of right shoulder 03/13/2018   Overweight (BMI 25.0-29.9) 12/03/2015   Hypothyroidism 12/03/2015   Elevated blood pressure reading 12/03/2015   Depression 12/03/2015   REFERRING PROVIDER: Delfin Gant, MD  REFERRING DIAG: Pain in left hip  THERAPY DIAG:  Pain in left hip  Stiffness of left hip, not elsewhere classified  Rationale for Evaluation and Treatment Rehabilitation  ONSET DATE: about 1 year ago  SUBJECTIVE:   SUBJECTIVE STATEMENT: LT hip  pain upon arrival 4/10  PERTINENT HISTORY: None reported  PAIN:  No pain on arrival.  PRECAUTIONS: Fall  WEIGHT BEARING RESTRICTIONS No   OBJECTIVE:   PATIENT SURVEYS:  FOTO 41% limitation  TODAY'S TREATMENT:                                   8/22EXERCISE LOG  Exercise Repetitions and Resistance Comments  Nustep L3 X10 min   SKTC    L figure 4 stretch    Bridge X10 reps, x10 hold 5 secs   RTSL LLE clam 3x10 reps   RTSL IR 2x10 (4/10)   RTSL hip abduction 2x10 reps   Hooklying Adductor ball squeeze 2x10 hold 5 secs   Standing HIP ABD     Blank cell = exercise not performed today    Modalities  Date:  Unattended Estim: Hip, Pre-Mod, 10 mins, Pain Hot Pack: Hip, 10 mins, Pain  PATIENT EDUCATION:  Education details: POC, HEP, healing Person educated: Patient Education method: Explanation Education comprehension: verbalized understanding   HOME EXERCISE PROGRAM: [id number]  ASSESSMENT:  CLINICAL IMPRESSION: Pt arrived today doing fairly well with LT hip pain 4/10.Rx focused on LT hip ROM and light strengthening exs mainly on the mat. She was able to perform all exs fairly well with some soreness and fatigue. IR was the most challenging due to  increased soreness after the second set. Estim/ HMP end of session to decrease pain/soreness.3-4/10   OBJECTIVE IMPAIRMENTS decreased activity tolerance, decreased balance, decreased mobility, difficulty walking, decreased ROM, decreased strength, postural dysfunction, and pain.   ACTIVITY LIMITATIONS lifting, bending, squatting, sleeping, stairs, and locomotion level  PARTICIPATION LIMITATIONS: community activity, occupation, and yard work  PERSONAL FACTORS Time since onset of injury/illness/exacerbation are also affecting patient's functional outcome.   REHAB POTENTIAL: Good  CLINICAL DECISION MAKING: Evolving/moderate complexity  EVALUATION COMPLEXITY: Moderate   GOALS: Goals reviewed with  patient? No  LONG TERM GOALS: Target date: 10/30/2021   Patient will be independent with her HEP.  Baseline:  Goal status: INITIAL  2.  Patient will be able to complete her daily activities without her familiar pain exceeding 4/10. Baseline:  Goal status: INITIAL  3.  Patient will be able to navigate at least 4 steps with a reciprocal pattern without being limited by her familiar left hip pain.  Baseline:  Goal status: INITIAL  4.  Patient will be able to lift at least 15 pounds without being limited by her familiar left hip pain.  Baseline:  Goal status: INITIAL   PLAN: PT FREQUENCY: 2x/week  PT DURATION: 4  weeks  PLANNED INTERVENTIONS: Therapeutic exercises, Therapeutic activity, Neuromuscular re-education, Balance training, Patient/Family education, Self Care, Joint mobilization, Stair training, Dry Needling, Electrical stimulation, Cryotherapy, Moist heat, Taping, Manual therapy, and Re-evaluation  PLAN FOR NEXT SESSION: nustep, long axis distraction, bridges, side stepping, and modalities as needed   Bekah Igoe,CHRIS, PTA 10/10/2021, 5:39 PM

## 2021-10-12 ENCOUNTER — Ambulatory Visit: Payer: 59 | Admitting: *Deleted

## 2021-10-12 ENCOUNTER — Encounter: Payer: Self-pay | Admitting: *Deleted

## 2021-10-12 DIAGNOSIS — M25552 Pain in left hip: Secondary | ICD-10-CM

## 2021-10-12 DIAGNOSIS — M25652 Stiffness of left hip, not elsewhere classified: Secondary | ICD-10-CM

## 2021-10-12 NOTE — Therapy (Addendum)
OUTPATIENT PHYSICAL THERAPY LOWER EXTREMITY TREATMENT   Patient Name: Megan Hicks MRN: [id number] DOB:1965/08/27, 56 y.o., female Today's Date: 10/12/2021   PT End of Session - 10/12/21 1042     Visit Number 4    Number of Visits 8    Date for PT Re-Evaluation 11/03/21    PT Start Time 1031    PT Stop Time 1122    PT Time Calculation (min) 51 min             Past Medical History:  Diagnosis Date   Anxiety    Depression    Headache    migraines   History of kidney stones    Hypothyroidism    Thyroid disease    Past Surgical History:  Procedure Laterality Date   COLONOSCOPY N/A 01/29/2018   Procedure: COLONOSCOPY;  Surgeon: Corbin Ade, MD;  [facility];  Service: Endoscopy;  Laterality: N/A;  2:45pm   None     Patient Active Problem List   Diagnosis Date Noted   Pain in joint of right shoulder 03/13/2018   Overweight (BMI 25.0-29.9) 12/03/2015   Hypothyroidism 12/03/2015   Elevated blood pressure reading 12/03/2015   Depression 12/03/2015   REFERRING PROVIDER: Delfin Gant, MD  REFERRING DIAG: Pain in left hip  THERAPY DIAG:  Pain in left hip  Stiffness of left hip, not elsewhere classified  Rationale for Evaluation and Treatment Rehabilitation  ONSET DATE: about 1 year ago  SUBJECTIVE:   SUBJECTIVE STATEMENT: LT hip  pain upon arrival 4/10 ache, but did more yesterday.  PERTINENT HISTORY: None reported  PAIN:  No pain on arrival.  PRECAUTIONS: Fall  WEIGHT BEARING RESTRICTIONS No   OBJECTIVE:   PATIENT SURVEYS:  FOTO 41% limitation  TODAY'S TREATMENT:                                   8/24        EXERCISE LOG  Exercise Repetitions and Resistance Comments  Nustep L3 X10 min   SKTC    L figure 4 stretch    Bridge 2 x10 hold 5 secs   RTSL LLE clam 3x10 reps   RTSL IR 2x10 (2/10)  with 2 pillows b/w knees   RTSL hip abduction 2x10 reps   Hooklying Adductor ball squeeze 2x10 hold 5 secs   Standing HIP ABD      Blank cell = exercise not performed today               Discussed HEP Modalities  Date:  Unattended Estim: Hip, Pre-Mod, 15 mins, Pain Hot Pack: Hip, 15 mins, Pain  PATIENT EDUCATION:  Education details: POC, HEP, healing Person educated: Patient Education method: Explanation Education comprehension: verbalized understanding   HOME EXERCISE PROGRAM: [id number]  ASSESSMENT:  CLINICAL IMPRESSION: Pt arrived today doing fairly well with LT hip pain 4/10, but aching when waking. Marland KitchenRx focused on LT hip ROM and light strengthening exs again. Decreased hip soreness with IR in sidelying when using pillows b/w knees. Good job today. Pt to perform HEP daily    PHYSICAL THERAPY DISCHARGE SUMMARY  Visits from Start of Care: 4  Current functional level related to goals / functional outcomes: Patient was unable to meet her goals for skilled physical therapy.    Remaining deficits: Pain and lower extremity strength   Education / Equipment: HEP    Patient agrees to discharge. Patient  goals were not met. Patient is being discharged due to not returning since the last visit.  Candi Leash, PT, DPT    OBJECTIVE IMPAIRMENTS decreased activity tolerance, decreased balance, decreased mobility, difficulty walking, decreased ROM, decreased strength, postural dysfunction, and pain.   ACTIVITY LIMITATIONS lifting, bending, squatting, sleeping, stairs, and locomotion level  PARTICIPATION LIMITATIONS: community activity, occupation, and yard work  PERSONAL FACTORS Time since onset of injury/illness/exacerbation are also affecting patient's functional outcome.   REHAB POTENTIAL: Good  CLINICAL DECISION MAKING: Evolving/moderate complexity  EVALUATION COMPLEXITY: Moderate   GOALS: Goals reviewed with patient? No  LONG TERM GOALS: Target date: 10/30/2021   Patient will be independent with her HEP.  Baseline:  Goal status: INITIAL  2.  Patient will be able to complete her daily  activities without her familiar pain exceeding 4/10. Baseline:  Goal status: INITIAL  3.  Patient will be able to navigate at least 4 steps with a reciprocal pattern without being limited by her familiar left hip pain.  Baseline:  Goal status: INITIAL  4.  Patient will be able to lift at least 15 pounds without being limited by her familiar left hip pain.  Baseline:  Goal status: INITIAL   PLAN: PT FREQUENCY: 2x/week  PT DURATION: 4 weeks  PLANNED INTERVENTIONS: Therapeutic exercises, Therapeutic activity, Neuromuscular re-education, Balance training, Patient/Family education, Self Care, Joint mobilization, Stair training, Dry Needling, Electrical stimulation, Cryotherapy, Moist heat, Taping, Manual therapy, and Re-evaluation  PLAN FOR NEXT SESSION: nustep, long axis distraction, bridges, side stepping, and modalities as needed   Bailyn Spackman,CHRIS, PTA 10/12/2021, 1:00 PM

## 2021-10-16 ENCOUNTER — Ambulatory Visit: Payer: 59 | Admitting: Podiatry

## 2021-10-16 ENCOUNTER — Ambulatory Visit: Payer: 59 | Admitting: Neurology

## 2021-10-17 ENCOUNTER — Encounter: Payer: 59 | Admitting: *Deleted

## 2021-10-19 ENCOUNTER — Encounter: Payer: 59 | Admitting: Physical Therapy

## 2021-10-24 ENCOUNTER — Ambulatory Visit (HOSPITAL_COMMUNITY): Payer: 59 | Attending: Neurology

## 2021-10-24 DIAGNOSIS — I6350 Cerebral infarction due to unspecified occlusion or stenosis of unspecified cerebral artery: Secondary | ICD-10-CM | POA: Diagnosis not present

## 2021-10-24 LAB — ECHOCARDIOGRAM COMPLETE
Area-P 1/2: 2.96 cm2
S' Lateral: 3 cm

## 2021-11-03 ENCOUNTER — Other Ambulatory Visit: Payer: Self-pay

## 2021-11-03 ENCOUNTER — Other Ambulatory Visit (INDEPENDENT_AMBULATORY_CARE_PROVIDER_SITE_OTHER): Payer: 59

## 2021-11-03 DIAGNOSIS — I6350 Cerebral infarction due to unspecified occlusion or stenosis of unspecified cerebral artery: Secondary | ICD-10-CM

## 2021-11-03 NOTE — Progress Notes (Unsigned)
14 day ZIO XT mailed to pt's home address.

## 2021-11-06 DIAGNOSIS — I6350 Cerebral infarction due to unspecified occlusion or stenosis of unspecified cerebral artery: Secondary | ICD-10-CM

## 2022-02-07 NOTE — Progress Notes (Signed)
NEUROLOGY FOLLOW UP OFFICE NOTE  MCKENZI BUONOMO [id number]  Assessment/Plan:   DWI-negative posterior circulation CVA/TIA vs migraine.  Even with negative MRI, would treat for TIA 2  Migraine without aura, without status migrainosus, not intractable 3  Hyperreflexia - May be physiologic but because she reports urinary dysfunction, will check for myelopathy     Check MRI of cervical and thoracic spine without Secondary stroke prevention as managed by PCP: ASA 81mg  daily Statin.  LDL goal less than 70 Normotensive blood pressure Hgb A1c goal less than 7 Further recommendations pending results.     Subjective:  Seri Kimmer is a 56 year old right-handed female with hypothyroidism who follows up for migraine and possible DWI-negative stroke.   UPDATE: Prescribed topiramate 25mg  at bedtime but never started it due to concerns of possible side effects.  However, she reports that headaches are better.  Denies any recent migraines.  May get a tension headache once every 2 weeks.  Manageable with Excedrin.     TIA work up:  2D echo on 10/24/2021 showed LVEF 55-60% with no intracardiac source of embolism detected.  2 week cardiac event monitor in September was negative.  The other night, she woke up having wet the bed.  This never happened before.  Denies back/leg pain, saddle anesthesia or extremity numbness or weakness, or neck pain.     HISTORY: On 04/15/2021, she was walking in the house when she suddenly became dizzy, described as spinning sensation.  She also noted her right eyelid became weak and shut.  When she struggled to open her right eye, she saw wavy horizontal lines in her vision.  No double vision with both eyes closed.  Her husband noticed that the right lower part of her face looked slightly drooped.  No headache, nausea, slurred speech, language disturbance, ataxia or unilateral numbness or weakness.  Dizziness, lower facial weakness and visual disturbance lasted 5  minutes.  She went to the ED but lett before being seen due to long wait time.  Over the next day, her eyelid remained droopy until it gradually improved and resolved by the following night.  She saw her PCP yesterday who took labs, including CMP, TSH and lipid panel, and advised to start a baby aspirin.  Underwent stroke workup: MRI brain and MRA head and neck performed on 05/07/2021 revealed normal brain but did show moderate stenosis of the left vertebral artery at the V1/V2 junction.    She has past history of migraines with visual aura which present as vertical lines.  They are infrequent.  She had a CTA of head and neck on 06/13/2016 to evaluate headache which was personally reviewed and was unremarkable.  previously tried rizatriptan.    PAST MEDICAL HISTORY: Past Medical History:  Diagnosis Date   Anxiety    Depression    Headache    migraines   History of kidney stones    Hypothyroidism    Thyroid disease     MEDICATIONS: Current Outpatient Medications on File Prior to Visit  Medication Sig Dispense Refill   Aspirin-Acetaminophen-Caffeine (EXCEDRIN PO) Take 1 tablet by mouth every 6 (six) hours as needed (headache).      levothyroxine (SYNTHROID) 25 MCG tablet Take 1 tablet (25 mcg total) by mouth daily. Take 1 Tablet by mouth once daily before breakfast 90 tablet 3   propranolol (INDERAL) 10 MG tablet      rosuvastatin (CRESTOR) 40 MG tablet Take 20 mg by mouth daily.  sertraline (ZOLOFT) 100 MG tablet Take 1.5 tablets (150 mg total) by mouth daily. Needs to be seen for further refills. 30 tablet 0   topiramate (TOPAMAX) 25 MG tablet Take 1 tablet (25 mg total) by mouth at bedtime. 30 tablet 5   No current facility-administered medications on file prior to visit.    ALLERGIES: No Known Allergies  FAMILY HISTORY: Family History  Problem Relation Age of Onset   Arthritis Mother    Hearing loss Mother    Hypertension Mother    Arthritis Father    Diabetes Father     Colon cancer Neg Hx    Colon polyps Neg Hx       Objective:  Blood pressure 123/82, pulse 65, height 5\' 3"  (1.6 m), weight 192 lb 3.2 oz (87.2 kg), SpO2 99 %. General: No acute distress.  Patient appears well-groomed.   Head:  Normocephalic/atraumatic Eyes:  Fundi examined but not visualized Neck: supple, no paraspinal tenderness, full range of motion Heart:  Regular rate and rhythm Back: No paraspinal tenderness Neurological Exam: alert and oriented to person, place, and time.  Speech fluent and not dysarthric, language intact.  CN II-XII intact. Bulk and tone normal, muscle strength 5/5 throughout.  Sensation to light touch intact.  Deep tendon reflexes 3+ throughout but more brisk in patellars, Babinski sign absent, Hoffman sign absent  Finger to nose testing intact.  Gait normal, Romberg negative.   , DO  CC: Shon Millet, NP

## 2022-02-13 ENCOUNTER — Ambulatory Visit: Payer: 59 | Admitting: Neurology

## 2022-02-13 ENCOUNTER — Encounter: Payer: Self-pay | Admitting: Neurology

## 2022-02-13 VITALS — BP 123/82 | HR 65 | Ht 63.0 in | Wt 192.2 lb

## 2022-02-13 DIAGNOSIS — G43809 Other migraine, not intractable, without status migrainosus: Secondary | ICD-10-CM

## 2022-02-13 DIAGNOSIS — I6350 Cerebral infarction due to unspecified occlusion or stenosis of unspecified cerebral artery: Secondary | ICD-10-CM | POA: Diagnosis not present

## 2022-02-13 DIAGNOSIS — R292 Abnormal reflex: Secondary | ICD-10-CM | POA: Diagnosis not present

## 2022-02-13 DIAGNOSIS — R32 Unspecified urinary incontinence: Secondary | ICD-10-CM

## 2022-02-13 DIAGNOSIS — G43009 Migraine without aura, not intractable, without status migrainosus: Secondary | ICD-10-CM

## 2022-02-13 NOTE — Patient Instructions (Addendum)
Will check MRI of cervical and thoracic spine without contrast Continue aspirin 81mg  daily and crestor

## 2022-03-11 ENCOUNTER — Ambulatory Visit
Admission: RE | Admit: 2022-03-11 | Discharge: 2022-03-11 | Disposition: A | Discharge status: Home / Self Care | Payer: 59 | Source: Ambulatory Visit | Attending: Neurology | Admitting: Neurology

## 2022-03-11 DIAGNOSIS — R292 Abnormal reflex: Secondary | ICD-10-CM

## 2022-03-12 ENCOUNTER — Telehealth: Payer: Self-pay

## 2022-03-12 DIAGNOSIS — R292 Abnormal reflex: Secondary | ICD-10-CM

## 2022-03-12 NOTE — Telephone Encounter (Signed)
-----  Message from Pieter Partridge, DO sent at 03/12/2022  7:03 AM EST ----- MRI of spinal cord normal.  I would like to check X-ray of lumbar spine to look for possibility of a slipped disc that may be causing left foot numbness and episode of urinary incontinence

## 2023-04-19 ENCOUNTER — Telehealth: Payer: Self-pay | Admitting: Cardiovascular Disease

## 2023-04-19 NOTE — Telephone Encounter (Signed)
 Pt's husband dropped off FMLA paperwork needed for pt to continue care for husband (Delma G.). Pt received ROI & Billing forms to complete & return at convenience + paid $29 fee.   Forms left in Dr. Renaye Rakers mailbox.  JB, 04-19-23

## 2023-04-20 ENCOUNTER — Emergency Department (HOSPITAL_COMMUNITY)
Admission: EM | Admit: 2023-04-20 | Discharge: 2023-04-20 | Disposition: A | Discharge status: Home / Self Care | Attending: Emergency Medicine | Admitting: Emergency Medicine

## 2023-04-20 ENCOUNTER — Emergency Department (HOSPITAL_COMMUNITY)

## 2023-04-20 ENCOUNTER — Other Ambulatory Visit: Payer: Self-pay

## 2023-04-20 ENCOUNTER — Encounter (HOSPITAL_COMMUNITY): Payer: Self-pay | Admitting: *Deleted

## 2023-04-20 DIAGNOSIS — B9789 Other viral agents as the cause of diseases classified elsewhere: Secondary | ICD-10-CM | POA: Insufficient documentation

## 2023-04-20 DIAGNOSIS — J069 Acute upper respiratory infection, unspecified: Secondary | ICD-10-CM | POA: Diagnosis not present

## 2023-04-20 DIAGNOSIS — J029 Acute pharyngitis, unspecified: Secondary | ICD-10-CM | POA: Insufficient documentation

## 2023-04-20 DIAGNOSIS — J209 Acute bronchitis, unspecified: Secondary | ICD-10-CM | POA: Insufficient documentation

## 2023-04-20 DIAGNOSIS — R059 Cough, unspecified: Secondary | ICD-10-CM | POA: Diagnosis present

## 2023-04-20 LAB — COMPREHENSIVE METABOLIC PANEL
ALT: 15 U/L (ref 0–44)
AST: 19 U/L (ref 15–41)
Albumin: 3.7 g/dL (ref 3.5–5.0)
Alkaline Phosphatase: 82 U/L (ref 38–126)
Anion gap: 15 (ref 5–15)
BUN: 10 mg/dL (ref 6–20)
CO2: 23 mmol/L (ref 22–32)
Calcium: 9.7 mg/dL (ref 8.9–10.3)
Chloride: 102 mmol/L (ref 98–111)
Creatinine, Ser: 0.75 mg/dL (ref 0.44–1.00)
GFR, Estimated: >60 mL/min (ref >60)
Glucose, Bld: 113 mg/dL — ABNORMAL HIGH (ref 70–99)
Potassium: 4 mmol/L (ref 3.5–5.1)
Sodium: 140 mmol/L (ref 135–145)
Total Bilirubin: 0.4 mg/dL (ref 0.0–1.2)
Total Protein: 7.6 g/dL (ref 6.5–8.1)

## 2023-04-20 LAB — CBC WITH DIFFERENTIAL/PLATELET
Abs Immature Granulocytes: 0.04 10*3/uL (ref 0.00–0.07)
Basophils Absolute: 0.1 10*3/uL (ref 0.0–0.1)
Basophils Relative: 1 %
Eosinophils Absolute: 0.3 10*3/uL (ref 0.0–0.5)
Eosinophils Relative: 3 %
HCT: 41.6 % (ref 36.0–46.0)
Hemoglobin: 13.6 g/dL (ref 12.0–15.0)
Immature Granulocytes: 0 %
Lymphocytes Relative: 25 %
Lymphs Abs: 2.5 10*3/uL (ref 0.7–4.0)
MCH: 30.4 pg (ref 26.0–34.0)
MCHC: 32.7 g/dL (ref 30.0–36.0)
MCV: 93.1 fL (ref 80.0–100.0)
Monocytes Absolute: 0.8 10*3/uL (ref 0.1–1.0)
Monocytes Relative: 8 %
Neutro Abs: 6.5 10*3/uL (ref 1.7–7.7)
Neutrophils Relative %: 63 %
Platelets: 318 10*3/uL (ref 150–400)
RBC: 4.47 MIL/uL (ref 3.87–5.11)
RDW: 12 % (ref 11.5–15.5)
WBC: 10.2 10*3/uL (ref 4.0–10.5)
nRBC: 0 % (ref 0.0–0.2)

## 2023-04-20 MED ORDER — ALBUTEROL SULFATE HFA 108 (90 BASE) MCG/ACT IN AERS: 2.0000 | INHALATION_SPRAY | RESPIRATORY_TRACT | Status: DC | PRN

## 2023-04-20 MED ORDER — ALBUTEROL SULFATE HFA 108 (90 BASE) MCG/ACT IN AERS
1.0000 | INHALATION_SPRAY | Freq: Four times a day (QID) | RESPIRATORY_TRACT | 0 refills | Status: AC | PRN
Start: 2023-04-20 — End: ?

## 2023-04-20 MED ORDER — AMOXICILLIN-POT CLAVULANATE 875-125 MG PO TABS: 1.0000 | ORAL_TABLET | Freq: Two times a day (BID) | ORAL | 0 refills | Status: AC

## 2023-04-20 NOTE — ED Notes (Signed)
 Pt left with husband in no new onset distress, education/discharge instruction given and reinforced.

## 2023-04-20 NOTE — ED Triage Notes (Signed)
 Pt here POV for sore throat, coughing up bloody/green sputum and fatigue x 1 week.  Was tested at [facility] for strep and Resp panel - all negative.  [facility], so pt elected to come here.

## 2023-04-20 NOTE — ED Provider Notes (Signed)
 [place] EMERGENCY DEPARTMENT AT [facility] Provider Note   CSN: [id number] Arrival date & time: 04/20/23  1415     History  Chief Complaint  Patient presents with   Cough    Megan Hicks is a 58 y.o. female here presenting with cough.  Patient had some sore throat for the last several days and then has greenish sputum.  She states that it was blood-tinged as well.  Patient went to urgent care had negative COVID and flu and RSV and strep test.  However there is no chest x-ray so patient came here for chest x-ray.  The history is provided by the patient.       Home Medications Prior to Admission medications   Medication Sig Start Date End Date Taking? Authorizing Provider  albuterol (VENTOLIN HFA) 108 (90 Base) MCG/ACT inhaler Inhale 1-2 puffs into the lungs every 6 (six) hours as needed for wheezing or shortness of breath. 04/20/23  Yes Charlynne Pander, MD  amoxicillin-clavulanate (AUGMENTIN) 875-125 MG tablet Take 1 tablet by mouth every 12 (twelve) hours. 04/20/23  Yes Charlynne Pander, MD  Aspirin-Acetaminophen-Caffeine (EXCEDRIN PO) Take 1 tablet by mouth every 6 (six) hours as needed (headache).     [provider]  levothyroxine (SYNTHROID) 25 MCG tablet Take 1 tablet (25 mcg total) by mouth daily. Take 1 Tablet by mouth once daily before breakfast 07/10/18   Rakes, Doralee Albino, FNP  propranolol (INDERAL) 10 MG tablet     [provider]  rosuvastatin (CRESTOR) 40 MG tablet Take 20 mg by mouth daily. 06/06/21   [provider]  sertraline (ZOLOFT) 100 MG tablet Take 1.5 tablets (150 mg total) by mouth daily. Needs to be seen for further refills. 07/07/19   Mechele Claude, MD      Allergies    Patient has no known allergies.    Review of Systems   Review of Systems  Respiratory:  Positive for cough.   All other systems reviewed and are negative.   Physical Exam Updated Vital Signs BP (!) 143/89 (BP Location: Left Arm)   Pulse 86    Temp 98.2 F (36.8 C) (Oral)   Resp 20   Ht 5\' 3"  (1.6 m)   Wt 83.9 kg   LMP 02/21/2020   SpO2 98%   BMI 32.77 kg/m  Physical Exam Vitals and nursing note reviewed.  Constitutional:      Appearance: Normal appearance.  HENT:     Head: Normocephalic.     Right Ear: Tympanic membrane normal.     Left Ear: Tympanic membrane normal.     Nose: Nose normal.     Mouth/Throat:     Mouth: Mucous membranes are moist.  Eyes:     Extraocular Movements: Extraocular movements intact.     Pupils: Pupils are equal, round, and reactive to light.  Cardiovascular:     Rate and Rhythm: Normal rate and regular rhythm.     Pulses: Normal pulses.     Heart sounds: Normal heart sounds.  Pulmonary:     Comments: Diminished bilaterally but no wheezing or crackle Abdominal:     General: Abdomen is flat.     Palpations: Abdomen is soft.  Musculoskeletal:        General: Normal range of motion.     Cervical back: Normal range of motion.  Skin:    General: Skin is warm.     Capillary Refill: Capillary refill takes less than 2 seconds.  Neurological:  General: No focal deficit present.     Mental Status: She is alert and oriented to person, place, and time.  Psychiatric:        Mood and Affect: Mood normal.        Behavior: Behavior normal.     ED Results / Procedures / Treatments   Labs (all labs ordered are listed, but only abnormal results are displayed) Labs Reviewed  COMPREHENSIVE METABOLIC PANEL - Abnormal; Notable for the following components:      Result Value   Glucose, Bld 113 (*)    All other components within normal limits  CBC WITH DIFFERENTIAL/PLATELET    EKG EKG Interpretation Date/Time:  Saturday April 20 2023 16:29:10 EST Ventricular Rate:  71 PR Interval:  156 QRS Duration:  79 QT Interval:  416 QTC Calculation: 453 R Axis:   60  Text Interpretation: Sinus rhythm Consider left atrial enlargement No significant change since last tracing Confirmed by Richardean Canal [phone]) on 04/20/2023 5:32:38 PM  Radiology DG Chest 2 View Result Date: 04/20/2023 CLINICAL DATA:  Cough EXAM: CHEST - 2 VIEW COMPARISON:  08/12/2020 FINDINGS: Cardiomediastinal silhouette and pulmonary vasculature are within normal limits. Lungs are clear. IMPRESSION: No acute cardiopulmonary process. Electronically Signed   By: Acquanetta Belling M.D.   On: 04/20/2023 16:55    Procedures Procedures    Medications Ordered in ED Medications  albuterol (VENTOLIN HFA) 108 (90 Base) MCG/ACT inhaler 2 puff (has no administration in time range)    ED Course/ Medical Decision Making/ A&P                                 Medical Decision Making [name] is a 58 y.o. female here presenting with cough and sore throat.  Likely bronchitis versus early pneumonia.  Patient had negative strep and COVID and RSV and flu in urgent care.  White blood cell count here is normal and chest x-ray did not show any pneumonia.  Urgent care prescribed Augmentin and albuterol already.  Stable for discharge   Amount and/or Complexity of Data Reviewed Labs: ordered. Decision-making details documented in ED Course. Radiology: ordered and independent interpretation performed. Decision-making details documented in ED Course.  Risk Prescription drug management.    Final Clinical Impression(s) / ED Diagnoses Final diagnoses:  Viral URI with cough  Acute bronchitis, unspecified organism  Pharyngitis, unspecified etiology    Rx / DC Orders ED Discharge Orders          Ordered    amoxicillin-clavulanate (AUGMENTIN) 875-125 MG tablet  Every 12 hours        04/20/23 1735    albuterol (VENTOLIN HFA) 108 (90 Base) MCG/ACT inhaler  Every 6 hours PRN        04/20/23 1737              Charlynne Pander, MD 04/20/23 1742

## 2023-04-20 NOTE — Discharge Instructions (Addendum)
 Take Augmentin twice daily for a week for pharyngitis  Use albuterol as needed for cough  Stay hydrated  See your doctor for follow-up  Return to ER if you have worse sore throat or cough or fever
